# Patient Record
Sex: Male | Born: 1945
Health system: Southern US, Community
[De-identification: ages and names within clinical notes are randomized; demographics above are authoritative.]

## PROBLEM LIST (undated history)

## (undated) DIAGNOSIS — Z9989 Dependence on other enabling machines and devices: Secondary | ICD-10-CM

## (undated) DIAGNOSIS — Z8673 Personal history of transient ischemic attack (TIA), and cerebral infarction without residual deficits: Secondary | ICD-10-CM

## (undated) DIAGNOSIS — E785 Hyperlipidemia, unspecified: Secondary | ICD-10-CM

## (undated) DIAGNOSIS — I10 Essential (primary) hypertension: Secondary | ICD-10-CM

## (undated) DIAGNOSIS — U071 COVID-19: Secondary | ICD-10-CM

## (undated) DIAGNOSIS — I4891 Unspecified atrial fibrillation: Secondary | ICD-10-CM

## (undated) HISTORY — DX: Personal history of transient ischemic attack (TIA), and cerebral infarction without residual deficits: Z86.73

## (undated) HISTORY — DX: Essential (primary) hypertension: I10

## (undated) HISTORY — DX: Unspecified atrial fibrillation: I48.91

## (undated) HISTORY — PX: KNEE ARTHROTOMY: SUR107

## (undated) HISTORY — DX: Dependence on other enabling machines and devices: Z99.89

## (undated) HISTORY — PX: COLONOSCOPY: SHX174

## (undated) HISTORY — DX: COVID-19: U07.1

## (undated) HISTORY — DX: Hyperlipidemia, unspecified: E78.5

---

## 2007-03-30 ENCOUNTER — Ambulatory Visit: Payer: Self-pay | Admitting: Cardiology

## 2007-04-06 ENCOUNTER — Ambulatory Visit: Payer: Self-pay | Admitting: Cardiology

## 2007-04-09 ENCOUNTER — Ambulatory Visit: Payer: Self-pay | Admitting: Cardiology

## 2007-04-13 ENCOUNTER — Ambulatory Visit: Payer: Self-pay | Admitting: Cardiology

## 2007-04-22 ENCOUNTER — Ambulatory Visit: Payer: Self-pay | Admitting: Cardiology

## 2007-04-27 ENCOUNTER — Ambulatory Visit: Payer: Self-pay | Admitting: Cardiology

## 2007-04-30 ENCOUNTER — Ambulatory Visit: Payer: Self-pay | Admitting: Cardiology

## 2007-05-17 ENCOUNTER — Ambulatory Visit: Payer: Self-pay | Admitting: Cardiology

## 2007-05-21 ENCOUNTER — Ambulatory Visit: Payer: Self-pay | Admitting: Internal Medicine

## 2007-11-04 ENCOUNTER — Ambulatory Visit: Payer: Self-pay | Admitting: Cardiology

## 2007-12-03 ENCOUNTER — Ambulatory Visit: Payer: Self-pay | Admitting: Internal Medicine

## 2010-08-20 NOTE — Cardiovascular Report (Signed)
[facility]                   [facility] [facility]LECTROP[facility] DEVICE CLINIC NOTE   NAME:Moler, KYEL PURK                      MRN:          [id number]  DATE:12/03/2007                            DOB:          1945-05-11    Mr. Rieth comes in at the request of Tereso Newcomer to follow up  __________ discussions about atrial fibrillation and whether there is  any role for changing and initiating Tikosyn therapy.  He had failed  amiodarone and was now in permanent atrial fibrillation.  The patient  has no symptoms related to his atrial fibrillation as best as I can  tell.  He denies any impairment of exercise tolerance and describes how  he can outwork men 20 or 30 years younger than him.  He does have  occasional tachypalpitations.   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  He has had no other episodes of lightheadedness.   CURRENT MEDICATIONS:  1. Lisinopril 10.  2. Lovastatin 40.  3. Warfarin.  4. Metoprolol.   PHYSICAL EXAMINATION:  GENERAL:  He was in no acute distress.  VITAL SIGNS:  His blood pressure was 114/76.  His pulse was 86.  SKIN:  Warm and dry.  LUNGS:  Clear.  HEART:  Sounds were irregular.  EXTREMITIES:  Without edema.   IMPRESSION:  1. Atrial fibrillation - Permanent.  2. Thromboembolic risk factors, notable for CHADS score of 1.  3. Occasional palpitations.  4. Transient episode of falling.   Mr. Cindric, as relates to his atrial fibrillation, does not really want  to do anything.  In the absence of symptoms, we have no data to suggest  that intervening would be appropriate.  He is not interested in going  through a process of trying to restore sinus rhythm in the hope that  ablation data give [place] long-term benefit information.   I am concerned about the falling episode and wonder whether it may be  related to  bradyarrhythmia.  I have given him a request to get a 24-hour  Holter monitor from the [place].   The other issue to address with Dr. Andee Lineman when he returns is whether he  should be on Coumadin.  With a CHADS score of 1, it certainly is up for  discussion.   We will see him again at Dr. Margarita Mail request.     Duke Salvia, MD, [facility]  Electronically Signed    SCK/MedQ  DD: 12/03/2007  DT: 12/04/2007  Job #: [id number]

## 2010-08-20 NOTE — Assessment & Plan Note (Signed)
[facility]                          [facility] OFFICE NOTE   NAME:Douglas Lee, Douglas Lee                      MRN:          [id number]  DATE:03/30/2007                            DOB:          03/26/1946    REASON FOR CONSULTATION:  Mr. Douglas Lee is a 65 year old male who was  diagnosed with paroxysmal atrial fibrillation by Dr. Fara Chute, this  past May.  He was subsequently started on Coumadin and has been closely  followed by Dr. Neita Carp with respect to Coumadin dosing.  However, he has  also been closely followed by Dr. Marina Goodell of the [facility] in [place], who subsequently referred him to the cardiology team at the [facility] in [place].  He reports having been extensively evaluated for  his new onset atrial fibrillation, by the cardiology team in [place],  with 2D echocardiography and exercise stress testing, both reportedly  within normal limits (records currently pending).  At that point, he was  also started on antiarrhythmic treatment for his atrial fibrillation  with amiodarone at an initial dose of 200 mg b.i.d. for approximately 4-  5 days.  He was then instructed to cut this to 200 mg daily, where he  has remained since this past July.  He has been closely followed with  surveillance blood work, including liver and thyroid studies.   The patient presents with serial EKGs from Dr. Dian Situ office  indicating persistent atrial fibrillation with controlled ventricular  response with rates in the 55-65 bpm range.  Of note, the patient was  also placed on metoprolol by the cardiology team in [place], currently  at a dosing of 50 mg b.i.d.   He now presents for a second opinion regarding treatment options for  atrial fibrillation, particularly with respect to possible DC  cardioversion.  This apparently has been entertained in the past, but  the patient would like to have this performed here in [place], where he  resides.   From a  clinical standpoint, Mr. Douglas Lee denies any remote, or recent,  development of exertional chest discomfort or dyspnea.  He also denies  any history of tachy palpitations.  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  He was noted  to have an irregular pulse and was subsequently referred to Dr. Neita Carp,  where atrial fibrillation was confirmed by electrocardiogram.  The  patient, himself, did not have any associated symptoms during the  initial physical examination, when he was found to have the irregular  pulse.   An electrocardiogram in our office today shows atrial fibrillation at a  rate of 64 bpm with borderline left axis deviation and no ischemic  changes.   Most recent protime indicates an INR of 2.6 on March 11, 2007.  Preceding levels notable for 4.8, 1.7, and 3.3 at the beginning of  November.   ALLERGIES:  PENICILLIN, SULFA, AND LIPITOR INTOLERANCE.   CURRENT MEDICATIONS:  1. Amiodarone 200 mg daily.  2. Coumadin 2.5 mg as directed.  3. Metoprolol  50 mg b.i.d.  4. Lovastatin 40 mg daily.  5. Lisinopril 10 mg daily.   PAST MEDICAL HISTORY:  1. Atrial fibrillation with controlled ventricular response.      a.     Initially diagnosed in May 2008.  2. Hypertension.      a.     Approximately 10 years.  3. Hyperlipidemia.   SURGICAL HISTORY:  Right knee surgery.   REVIEW OF SYSTEMS:  The patient denies any prior history of myocardial  infarction, congestive heart failure, or stroke.  He denies any history  of diabetes mellitus.  Otherwise as noted in HPI, remaining systems  negative.   SOCIAL HISTORY:  The patient is married, currently for the third time,  and has 2 children.  He served in [employer] for 5 years.  He is  currently working as an Nutritional therapist.  He has never smoked tobacco  on a regular basis, and has not drank any alcohol for the  last 20 years  or so.   FAMILY HISTORY:  Father died at age 60, fatal myocardial infarction.  Mother deceased, age 1, complications of stroke.  Brother age 31,  status post pacemaker.   PHYSICAL EXAMINATION:  Blood pressure 123/85, pulse 71.  Irregular.  Weight 191.6.  GENERAL:  A 65 year old male sitting upright in no distress.  HEENT:  Normocephalic, atraumatic.  NECK:  Palpable.  Bilateral carotid pulses without bruits.  LUNGS:  Clear to auscultation in all fields.  HEART:  Irregularly irregular.  (S1 and S2).  No significant murmurs.  No rubs or gallops.  ABDOMEN:  Soft, nontender, intact bowel sounds.  EXTREMITIES:  Palpable peripheral pulses with no significant edema.  NEUROLOGIC:  No focal deficits.   IMPRESSION:  1. Persistent atrial fibrillation.      a.     Controlled ventricular response, on amiodarone and       metoprolol.      b.     CHAD2 score:  1 (hypertension).  2. Hypertension.  3. Hyperlipidemia.   PLAN:  1. Request all pertinent records from the cardiology group at the [facility] in [place], particularly with respect to 2D      echocardiography and stress test results.  2. We discussed the treatment option of direct current cardioversion      with Mr. Douglas Lee, who is quite amenable to this option.  In fact, he      would very much like to not remain on Coumadin indefinitely.  We      will therefore enroll him in our Coumadin clinic, beginning today      with a baseline protime, and closely monitor him over the next      several weeks to establish therapeutic INR readings for 4      consecutive weeks.  At that point, plan is then to schedule him for      elective DC cardioversion with Dr. Willa Rough, followed by an      additional 4 weeks of Coumadin anticoagulation.  If DC      cardioversion is successful, and the patient maintains normal sinus      rhythm, then we will discuss whether or not he can safely be taken      off Coumadin and  substitute this with aspirin.  Of note, given that      the patient has been fully loaded, by this point in time, with the      amiodarone,  he has a relatively higher likelihood of successful DC      cardioversion to normal sinus rhythm.  3. Schedule return clinic followup with myself and Dr. Willa Rough in      approximately 3 weeks, for review of his hospital records from      [facility] and continued clinical monitoring.      Rozell Searing, PA-C  Electronically Signed      Luis Abed, MD, [facility]  Electronically Signed   GS/MedQ  DD: 03/30/2007  DT: 03/30/2007  Job #: [id number]   cc:   Beaulah Corin, MD

## 2010-08-20 NOTE — Letter (Signed)
May 21, 2007    Learta Codding, MD,FACC  [address]  [place], [place] [address]   RE:  Douglas Lee, Douglas Lee  MRN:  [id number]  /  DOB:  1945/09/24   Dear Michelle Piper,   It was a pleasure to see Theophil Thivierge today at your request because of  atrial fibrillation.  He was found to have atrial fibrillation that  presented asymptomatically in May, 2008.  He then underwent initiation  of therapy with amiodarone with attempts to convert him, which was done  in January.  He failed to hold sinus rhythm, and his amiodarone has  intracurrently been discontinued.   The patient was unaware of his atrial fibrillation, and he has noted no  untoward effects in terms of his activity levels, shortness of breath,  edema, etc.   His thromboembolic risk factors are notable for hypertension.   His past medical history, in addition to the above, are notable for  dyslipidemia.   Past surgical history is notable for knee surgery.   SOCIAL HISTORY:  Patient is married, currently for the third time.  He  has two children.  He works as an Nutritional therapist.  He does not use  cigarettes, alcohol, or recreational drugs.   REVIEW OF SYSTEMS:  Noncontributory.   FAMILY HISTORY:  Notable for coronary artery disease.   His cardiac evaluation here to date apparently included an echo and a  stress test, the latter of which sounds like it was just graded exercise  that was associated with a rapid ventricular response.  This apparently  was undertaken prior to the initiation of beta blocker therapy.   PHYSICAL EXAMINATION:  He is an older Caucasian male appearing his  stated age of 30.  Blood pressure 133/86, pulse 64.  His weight was 190.  HEENT:  No icterus or xanthoma.  Neck veins are flat.  Carotids are brisk and full bilaterally without  bruits.  BACK:  Without kyphosis or scoliosis.  LUNGS:  Clear.  Heart sounds are irregular without murmurs or gallops.  ABDOMEN:  Soft with active bowel sounds without  midline pulsation or  hepatomegaly.  Femoral pulses are 2+.  Distal pulses are intact.  There is no clubbing,  cyanosis or edema.  NEUROLOGIC:  Grossly normal.  SKIN:  Warm and dry.   Electrocardiograms were reviewed.  They all demonstrated coarse atrial  fibrillation with controlled ventricular response.   IMPRESSION:  1. Atrial fibrillation, persistent/permanent.  2. Failed cardioversion on amiodarone.  3. Thromboembolic risk factors notable for hypertension with a CHADS      score of 1.  4. Dyslipidemia.   DISCUSSION:  Michelle Piper, Mr. Seehafer and I had a lengthy discussion regarding  treatment options for his atrial fibrillation.  Obviously, in the  absence of symptoms, symptomatic improvement cannot be accomplished by  medical therapy or ablation therapy.  There are ongoing clinical trials  asking the question in patients with persistent or paroxysmal atrial  fibrillation, does randomization to ablation therapy reduce mortality  compared to drug therapy, and knowing that drug therapy and rate control  therapy are equivalent over time, potentially extrapolatable to a rate  control course.  These trials almost certainly are going to be excluding  permanent atrial fibrillation because of lower success rates in this  cohort.  Thus, the only benefit that I could see in terms of intervening  with Mr. Davalos would be to ask the question, if we were able to get him  into atrial fibrillation and maintain him in atrial fibrillation, might  he then potentially be available for an ablative procedure, which might  have an impact on heart end points like stroke and/or death.  Obviously,  there are a lot of questions in that statement, and I have tried to  review these extensively with Mr. Macha so as he could make the best  decision we could.   In light of that, we will plan to:  1. Get his echo to look at his left atrial dimension.  2. Get an amiodarone level.  3. Consider the initiation of  Tikosyn to see if we can restore sinus      rhythm.  We notably did      discuss the potential pro-arrhythmic effects of Tikosyn and the      need for inpatient hospitalization and monitoring.   Thanks very much for allowing [place] to see him.    Sincerely,      Duke Salvia, MD, [facility]  Electronically Signed    SCK/MedQ  DD: 05/21/2007  DT: 05/24/2007  Job #: [id number]

## 2010-08-20 NOTE — Assessment & Plan Note (Signed)
[facility]                          [facility] OFFICE NOTE   NAME:Douglas Lee, Douglas Lee                      MRN:          [id number]  DATE:04/13/2007                            DOB:          1945/08/07    [name] is here for the follow-up of his at coumadinization to  finalize the plans for his cardioversion.  See the extensive note done  for consultation on March 30, 2007.  Dr. Dian Situ group has done an  excellent job of following Mr. Kea's INR over time.  More recently,  we took this over purely for the purpose of being able to watch it very  carefully as we prepared for cardioversion to be sure that we did not  cardiovert him on a day that his INR was less than 2.0.  Historically he  has been on 2.5 mg of Coumadin a day.  When his dose is increased to 2.5  alternating with 3 it appears that his INR goes too high.  However, when  the dose is lowered the INR can then drift down below 2.0.  With this in  mind, he has been on a dosing regimen of approximately two doses of 3.0  per week with the other doses being 2.5 mg.  His INR today is 2.9.  It  is very careful for [place] to stay slightly on the high side at the time of  cardioversion.  Therefore, I will keep him on 2.5 mg of Coumadin daily  with two doses of 3 mg per week, and this will be continued until we do  the cardioversion on the morning of April 26, 2007.  Cardioversion  will be arranged for that day.  He will have his INR checked next week.  I do not want his dose changed unless his INR were to be greater then  4.7.  I can be reached by cell phone when he is checked next week even  though I will be out of the state and we will make any adjustments  needed.  He understands that there is a slight chance that I will have  to change my schedule on the 19th, in which case cardioversion date will  be definitely moved to Friday the 23rd.   PAST MEDICAL HISTORY:  ALLERGIES:  PENICILLIN,  SULFA, LIPITOR AND EES.   MEDICATIONS:  Lisinopril, lovastatin, Coumadin, metoprolol and  amiodarone 100 mg b.i.d.   OTHER MEDICAL PROBLEMS:  See the list on the note of March 30, 2007.   REVIEW OF SYSTEMS:  He feels well today.  His review of systems is  negative.   PHYSICAL EXAM:  Blood pressure today is 125/83, heart rate is 60 but it  is irregularly irregular, his weight is 191 pounds.  The patient is  oriented to person, time and place.  Affect is normal.  HEENT:  Reveals no xanthelasma, he has normal extraocular motion.  LUNGS:  Clear.  CARDIAC:  Reveals an S1 with an S2, there are no clicks or significant  murmurs.  There is no peripheral edema.   PROBLEMS:  Listed on the  note of March 30, 2007.  1. Atrial fibrillation.  We will proceed with cardioversion on January      19 assuming there are no major changes between now and then.     Luis Abed, MD, [facility]  Electronically Signed    JDK/MedQ  DD: 04/13/2007  DT: 04/13/2007  Job #: [id number]   cc:   Fara Chute

## 2010-08-20 NOTE — Assessment & Plan Note (Signed)
[facility]                          [facility] OFFICE NOTE   NAME:Kasparek, JAWAN CHAVARRIA                      MRN:          [id number]  DATE:11/04/2007                            DOB:          Sep 18, 1945    CARDIOLOGIST:  Luis Abed, MD, [facility].   PRIMARY CARE PHYSICIAN:  Dr. Fara Chute.   REASON FOR VISIT:  Followup.   HISTORY OF PRESENT ILLNESS:  Mr. Sparling is a 65 year old male patient  with a history of persistent/permanent atrial fibrillation.  He  underwent attempted cardioversion on amiodarone therapy but failed this.  Amiodarone therapy has since been discontinued.  He has been maintained  on Coumadin therapy, although his CHADS2 score is 1 (hypertension).  The  patient returns for followup today.  He was last seen in the office by  Dr. Graciela Husbands in February 2009.  At that point in time, there was a  discussion of whether or not to proceed with Tikosyn therapy to restore  sinus rhythm.  The patient had an amiodarone level done in February 2009  that was low at 0.5.  We obtained his echocardiogram results from the [place]  that revealed mild left atrial enlargement at 44 mm, normal LV  contractility, and an EF of 55-60%, mild MR, and mild TR.  The patient  returns for followup today.  Overall, he is doing well.  He denies chest  pain, shortness of breath, syncope, near syncope, palpitations,  orthopnea, PND, or pedal edema.   CURRENT MEDICATIONS:  1. Lisinopril 10 mg daily.  2. Lovastatin 40 mg daily.  3. Warfarin as directed by Dr. Neita Carp.  4. Metoprolol 100 mg daily.  5. Multivitamin.  6. Vitamin C.  7. Vitamin D.  8. Vitamin A.  9. Garlic.  10.Fish oil.   PHYSICAL EXAMINATION:  GENERAL:  He is a well-nourished and well-  developed male, in no acute distress.  VITAL SIGNS:  Blood pressure is 128/87, pulse 67, and weight 190 pounds.  HEENT:  Normal.  CARDIAC:  Normal S1 and S2.  Regular rate and rhythm.  LUNGS:  Clear to auscultation  bilaterally.  NEUROLOGIC:  He is alert and oriented x3.  Cranial nerves II-XII are  grossly intact.   Electrocardiogram reveals atrial fibrillation with heart rate of 71, no  acute changes.   IMPRESSION:  1. Persistent/permanent atrial fibrillation      a.     CHADS2 score of 1 (hypertension).      b.     Coumadin therapy.      c.     History of failed cardioversion on amiodarone - amiodarone       discontinued.   PLAN:  Mr. Lares returns for followup regarding his arrhythmia.  Discussion was held with Dr. Graciela Husbands when he saw him in February about  proceeding with Tikosyn therapy.  The patient is open to this versus  continued rate control.  The patient has not yet seen Dr. Graciela Husbands back in  followup.  We will facilitate a followup appointment with Dr. Graciela Husbands  towards the end of August to discuss this issue further.  No medication  changes have been made today.      Tereso Newcomer, PA-C  Electronically Signed      Jonelle Sidle, MD  Electronically Signed   SW/MedQ  DD: 11/04/2007  DT: 11/05/2007  Job #: [id number]   cc:   Fara Chute

## 2010-08-23 NOTE — Assessment & Plan Note (Signed)
[facility]                          [facility] OFFICE NOTE   NAME:Douglas Lee, Douglas Lee                      MRN:          [id number]  DATE:05/16/2006                            DOB:          16-Dec-1945    HISTORY OF PRESENT ILLNESS:  The patient is a 65 year old male  previously diagnosed with paroxysmal atrial fibrillation by Dr. Neita Carp  in May.  The patient was seen and started on the Coumadin and has been  closely followed with respect to his Coumadin dosing.  The patient also  has been followed by Dr. Marina Goodell at the [facility],  referred through a cardiology team at the [facility] in [place].  He  underwent stress testing and echocardiographic study which reportedly  was within normal limits.  The patient was also started on anti-  arrhythmic therapy for atrial fibrillation, amiodarone, initial dose of  20 mg p.o. b.i.d.  The patient was then seen by our team on March 30, 2007 when he presented with presumed atrial fibrillation.  On our  review, the electrocardiograms suggested that the patient may actually  have atrial flutter.  He was still taking amiodarone and metoprolol.  He  is relatively asymptomatic in the presence of amiodarone.  The patient  was seen by Dr. Myrtis Ser, scheduled for a cardioversion.  The patient was  cardioverted on April 30, 2007 and reverted back to normal sinus  rhythm; however, he now presents to the office for followup, and appears  to be back in atrial arrhythmia.  The review of the EKG, however,  demonstrated the patient likely has atrial flutter with variable block.  The patient also tells me today that he declines to take any further  amiodarone, does not want to be long term on either anti-arrhythmic drug  therapy, and/or Coumadin therapy.  I explained carefully to the patient  his [place] score is low, and that from a Coumadin perspective it is only  required around the cardioversion.   The  patient denies any chest pain, shortness of breath, orthopnea, PND,  no palpitations or syncope.   MEDICATIONS:  1. Lisinopril 10 mg a day.  2. Lovastatin 40 mg a day.  3. Warfarin 2 mg p.o. daily.  4. Amiodarone 200 mg, 100 mg in the morning and 100 in the evening.  5. Metoprolol 100 mg half tablet p.o. b.i.d.  6. Multivitamin.  7. Vitamin C.  8. Vitamin D.  9. Vitamin A.  10.Garlic.  11.Fish oil.   INCOMPLETE DICTATION   CANCELED DICTATION     Learta Codding, MD,FACC  Electronically Signed    GED/MedQ  DD: 05/19/2007  DT: 05/20/2007  Job #: [id number]   cc:   Dr. Marina Goodell (unspecified)

## 2010-08-23 NOTE — Assessment & Plan Note (Signed)
[facility]                          [facility] OFFICE NOTE   NAME:Boehner, ROMANI WILBON                      MRN:          [id number]  DATE:04/30/2007                            DOB:          [birth year]    Mr. Safley was cardioverted at [facility] today, dictations were done.  He  actually converted with 100 joules and then within a few minutes with  back to atrial fib.  I then converted him with 200 joules and he  appeared to hold.  He went home on his usual meds.  Will make him an  appointment to see me in the office in approximately 2 weeks.  I  explained to him that he may not hold but we will see.     Luis Abed, MD, [facility]  Electronically Signed    JDK/MedQ  DD: 04/30/2007  DT: 04/30/2007  Job #: [id number]

## 2014-03-09 ENCOUNTER — Encounter: Payer: Self-pay | Admitting: Orthopedic Surgery

## 2014-03-09 ENCOUNTER — Ambulatory Visit (INDEPENDENT_AMBULATORY_CARE_PROVIDER_SITE_OTHER): Payer: Medicare Other

## 2014-03-09 ENCOUNTER — Ambulatory Visit (INDEPENDENT_AMBULATORY_CARE_PROVIDER_SITE_OTHER): Payer: Medicare Other | Admitting: Orthopedic Surgery

## 2014-03-09 VITALS — BP 175/103 | Ht 70.0 in | Wt 201.0 lb

## 2014-03-09 DIAGNOSIS — M2340 Loose body in knee, unspecified knee: Secondary | ICD-10-CM | POA: Insufficient documentation

## 2014-03-09 DIAGNOSIS — M2341 Loose body in knee, right knee: Secondary | ICD-10-CM

## 2014-03-09 DIAGNOSIS — M25561 Pain in right knee: Secondary | ICD-10-CM

## 2014-03-09 DIAGNOSIS — IMO0002 Reserved for concepts with insufficient information to code with codable children: Secondary | ICD-10-CM

## 2014-03-09 DIAGNOSIS — M2480 Other specific joint derangements of unspecified joint, not elsewhere classified: Secondary | ICD-10-CM

## 2014-03-09 NOTE — Progress Notes (Deleted)
   Subjective:    Patient ID: Douglas Lee, male    DOB: [birth year], 68 y.o.   MRN: [id number]  Knee Pain       Review of Systems     Objective:   Physical Exam        Assessment & Plan:  Diagnostic x-rays knee AP lateral with sunrise right knee  Patient is right knee pain  We have a well centered patella with some mild degenerative changes.  Lateral x-ray shows superior and inferior patellar osteophytes no other changes to note.  The right knee AP x-ray shows mild effusion with mild degenerative changes symmetric diffuse joint space narrowing mild sclerosis medial tibia physiologic valgus 67 noted  Mild degenerative arthritis right knee

## 2014-03-09 NOTE — Patient Instructions (Signed)
WE WILL SCHEDULE MRI FOR YOU AND CALL YOU WITH THE APPOINTMENT

## 2014-03-09 NOTE — Progress Notes (Signed)
Patient ID: Douglas Lee, male   DOB: [birth year], 68 y.o.   MRN: [id number] Subjective:    Douglas Lee is a 68 y.o. male who presents with a history of a loose body removed from his right knee 40 years ago he did well until about 15 months ago and got out of a truck and hyperextended his knee. He expressed extreme pain and he treated himself with Aleve and heat. However since that time he has noticed catching locking giving way symptoms of his knee especially has difficulty going up and down the stairs and cannot climb them sequentially. Notices intermittent swelling.  Past Medical History  Diagnosis Date  . Hypertension     Past Surgical History  Procedure Laterality Date  . Knee arthrotomy Right     40 yrs ago loose boby     Family History  Problem Relation Age of Onset  . Hypertension    . Heart attack      Social History History  Substance Use Topics  . Smoking status: Unknown If Ever Smoked  . Smokeless tobacco: Not on file  . Alcohol Use: No    Allergies not on file  Current Outpatient Prescriptions  Medication Sig Dispense Refill  . lisinopril (PRINIVIL,ZESTRIL) 10 MG tablet Take 10 mg by mouth daily.    Marland Kitchen lovastatin (MEVACOR) 40 MG tablet Take 40 mg by mouth at bedtime.    . metoprolol (LOPRESSOR) 100 MG tablet Take 100 mg by mouth 2 (two) times daily.     No current facility-administered medications for this visit.      Review of Systems A comprehensive review of systems was negative except for: Hearing loss or ringing in the ears irregular heartbeat with atrial fibrillation   Objective:    BP 175/103 mmHg  Ht 5\' 10"  (1.778 m)  Wt 201 lb (91.173 kg)  BMI 28.84 kg/m2 GENERAL normal appearance grooming and hygiene ORIENTATION to person place and time MOOD pleasant affect flat  UPPEREXTREMITIES: Normal  Ambulatory status normal  Right knee: Well-healed superolateral surgical incision with crepitance on range of motion tenderness over the lateral  patella. Tenderness medial joint line. Collateral ligaments are stable. Cruciate ligament posterior cruciate normal anterior cruciate ligament questionable Lockman positivity. Motor exam normal. No effusion. we also find crepitance on patellofemoral range of motion in the positive patellofemoral stress test for pain   Left knee:  normal, no effusion, full active range of motion, no joint line tenderness, ligamentous structures intact. and Normal strength   SKIN normal wall 4 extremities  CV normal all 4 extremities  LYMPH negative  SENSATION normal 4 extremities  DTR 2+ and equal bilaterally  COORDINATION BALANCE normal    Assessment:   Diagnostic x-rays knee AP lateral with sunrise right knee  Patient is right knee pain  We have a well centered patella with some mild degenerative changes.  Lateral x-ray shows superior and inferior patellar osteophytes no other changes to note.  The right knee AP x-ray shows mild effusion with mild degenerative changes symmetric diffuse joint space narrowing mild sclerosis medial tibia physiologic valgus 67 noted  Mild degenerative arthritis right knee  Hyperextension right knee with patellofemoral arthritis question loose body Plan:  Recommend MRI to assess for loose body and possible surgical excision.

## 2014-03-27 ENCOUNTER — Telehealth: Payer: Self-pay | Admitting: Orthopedic Surgery

## 2014-03-27 NOTE — Telephone Encounter (Signed)
i have no answer

## 2014-03-27 NOTE — Telephone Encounter (Signed)
Patient has spoken with Environmental education officer, Remo Lipps, following an apparent re-schedule of MRI, which had occurred via Radiology at [facility];  He is now scheduled back onto his original MRI appointment date, 03/30/14, (NOT 04/03/14 which Forestine Na tried to re-schedule to) and he is aware.  He had mentioned my name to Remo Lipps, however, I was uninvolved in the scheduling or re-scheduling process of MRI; I had spoken with him last Thursday, 03/23/14, when he called to inquire about appointment, which was followed by a call back from our back office staff.  His main concern is whether he is going to be able to have surgery by 04/05/14, which he states he discussed with Dr Aline Brochure, if indicated by MRI.  Please advise.  Patient's ph# is [phone].

## 2014-03-30 ENCOUNTER — Ambulatory Visit (HOSPITAL_COMMUNITY): Payer: Medicare Other

## 2014-03-30 ENCOUNTER — Ambulatory Visit (HOSPITAL_COMMUNITY)
Admission: RE | Admit: 2014-03-30 | Discharge: 2014-03-30 | Disposition: A | Discharge status: Home / Self Care | Payer: Medicare Other | Source: Ambulatory Visit | Attending: Orthopedic Surgery | Admitting: Orthopedic Surgery

## 2014-03-30 DIAGNOSIS — M25761 Osteophyte, right knee: Secondary | ICD-10-CM | POA: Insufficient documentation

## 2014-03-30 DIAGNOSIS — M25461 Effusion, right knee: Secondary | ICD-10-CM | POA: Insufficient documentation

## 2014-03-30 DIAGNOSIS — M25861 Other specified joint disorders, right knee: Secondary | ICD-10-CM | POA: Insufficient documentation

## 2014-03-30 DIAGNOSIS — IMO0002 Reserved for concepts with insufficient information to code with codable children: Secondary | ICD-10-CM

## 2014-03-30 DIAGNOSIS — M25561 Pain in right knee: Secondary | ICD-10-CM | POA: Insufficient documentation

## 2014-04-03 ENCOUNTER — Telehealth: Payer: Self-pay | Admitting: Orthopedic Surgery

## 2014-04-03 ENCOUNTER — Ambulatory Visit (HOSPITAL_COMMUNITY): Admission: RE | Admit: 2014-04-03 | Payer: Medicare Other | Source: Ambulatory Visit

## 2014-04-03 NOTE — Telephone Encounter (Signed)
Received call from patient following having MRI 03/30/14 - requests results; asking if still to be scheduled for surgery 04/05/14, and is awaiting this information.  Please advise. Ph# [phone]

## 2014-04-03 NOTE — Telephone Encounter (Signed)
04/03/14 Refer to new phone note entered today in which MRI results were given to patient, and follow up appointment scheduled, per Dr Aline Brochure, for 04/06/14.  Patient aware.

## 2014-04-03 NOTE — Telephone Encounter (Signed)
i reviewed the mri will not need surgery   Come in 31st ill discuss with him   Mri just showed arthritis

## 2014-04-03 NOTE — Telephone Encounter (Signed)
Patient aware and he has been scheduled

## 2014-04-06 ENCOUNTER — Ambulatory Visit (INDEPENDENT_AMBULATORY_CARE_PROVIDER_SITE_OTHER): Payer: Medicare Other | Admitting: Orthopedic Surgery

## 2014-04-06 ENCOUNTER — Encounter: Payer: Self-pay | Admitting: Orthopedic Surgery

## 2014-04-06 VITALS — BP 152/83 | Ht 70.0 in | Wt 195.0 lb

## 2014-04-06 DIAGNOSIS — M2241 Chondromalacia patellae, right knee: Secondary | ICD-10-CM | POA: Insufficient documentation

## 2014-04-06 DIAGNOSIS — M1711 Unilateral primary osteoarthritis, right knee: Secondary | ICD-10-CM

## 2014-04-06 NOTE — Patient Instructions (Addendum)
If the injection fails to provide improvement please call to make another appointment   You have received an injection of steroids into the joint. 15% of patients will have increased pain within the 24 hours postinjection.   This is transient and will go away.   We recommend that you use ice packs on the injection site for 20 minutes every 2 hours and extra strength Tylenol 2 tablets every 8 as needed until the pain resolves.  If you continue to have pain after taking the Tylenol and using the ice please call the office for further instructions.

## 2014-04-06 NOTE — Progress Notes (Signed)
Patient ID: Douglas Lee, male   DOB: 31-Dec-1945, 68 y.o.   MRN: [id number] Chief Complaint  Patient presents with  . Follow-up    MRI results of right knee.     reevaluate in review ongoing pain discomfort anterior aspect right knee and clicking sensation lateral aspect right knee. Patient status post arthroscopy and removal of loose body and incision at same setting lateral portion of knee. Complains of clicking popping when he bends his knee and discomfort behind his kneecap with occasional feeling that there is something in the joint.  Review of systems no numbness or tingling. Mechanical symptoms noted.  Reexamination you can feel a palpable clicking popping when he flexes and extends his knee is more over the lateral part of the leg and iliotibial band and intra-articular. His range of motion stability and strength examinations remain normal. Skin is intact. He walks normally. His vital signs are stable.  I've reviewed his MRI there is evidence of patellofemoral disease and arthritis with mild degeneration of the medial and lateral compartments. Small joint effusion noted no evidence of Baker's cyst small osteophytes are seen throughout the knee  Impression patellofemoral arthritis mild tibiofemoral arthritis  I reviewed the studies with the patient reexamined his knee    recommend he get an injection and call us back after a month if the injection doesn't help his pain or discomfort.  Procedure note right knee injection verbal consent was obtained to inject right knee joint  Timeout was completed to confirm the site of injection  The medications used were 40 mg of Depo-Medrol and 1% lidocaine 3 cc  Anesthesia was provided by ethyl chloride and the skin was prepped with alcohol.  After cleaning the skin with alcohol a 20-gauge needle was used to inject the right knee joint. There were no complications. A sterile bandage was applied.

## 2015-07-04 DIAGNOSIS — K642 Third degree hemorrhoids: Secondary | ICD-10-CM | POA: Diagnosis not present

## 2015-07-05 DIAGNOSIS — I10 Essential (primary) hypertension: Secondary | ICD-10-CM | POA: Diagnosis not present

## 2015-07-05 DIAGNOSIS — E7800 Pure hypercholesterolemia, unspecified: Secondary | ICD-10-CM | POA: Diagnosis not present

## 2015-07-05 DIAGNOSIS — K649 Unspecified hemorrhoids: Secondary | ICD-10-CM | POA: Diagnosis not present

## 2015-07-05 DIAGNOSIS — Z880 Allergy status to penicillin: Secondary | ICD-10-CM | POA: Diagnosis not present

## 2015-07-05 DIAGNOSIS — I4891 Unspecified atrial fibrillation: Secondary | ICD-10-CM | POA: Diagnosis not present

## 2015-07-05 DIAGNOSIS — Z882 Allergy status to sulfonamides status: Secondary | ICD-10-CM | POA: Diagnosis not present

## 2015-07-05 DIAGNOSIS — Z79899 Other long term (current) drug therapy: Secondary | ICD-10-CM | POA: Diagnosis not present

## 2015-07-06 DIAGNOSIS — K649 Unspecified hemorrhoids: Secondary | ICD-10-CM | POA: Diagnosis not present

## 2015-07-06 DIAGNOSIS — K625 Hemorrhage of anus and rectum: Secondary | ICD-10-CM | POA: Diagnosis not present

## 2015-07-12 HISTORY — PX: MINOR HEMORRHOIDECTOMY: SHX6238

## 2015-08-01 ENCOUNTER — Encounter: Payer: Self-pay | Admitting: *Deleted

## 2015-08-02 ENCOUNTER — Encounter: Payer: Self-pay | Admitting: Cardiology

## 2015-08-02 ENCOUNTER — Ambulatory Visit (INDEPENDENT_AMBULATORY_CARE_PROVIDER_SITE_OTHER): Payer: PPO | Admitting: Cardiology

## 2015-08-02 VITALS — BP 138/95 | HR 82 | Ht 70.0 in | Wt 195.8 lb

## 2015-08-02 DIAGNOSIS — I10 Essential (primary) hypertension: Secondary | ICD-10-CM

## 2015-08-02 DIAGNOSIS — I482 Chronic atrial fibrillation, unspecified: Secondary | ICD-10-CM

## 2015-08-02 DIAGNOSIS — E785 Hyperlipidemia, unspecified: Secondary | ICD-10-CM

## 2015-08-02 NOTE — Progress Notes (Signed)
Cardiology Office Note  Date: 08/02/2015   ID: Douglas, Lee 1945-06-03, MRN [id number]  PCP: Manon Hilding, MD  Evaluating Cardiologist: Rozann Lesches, MD   Chief Complaint  Patient presents with  . Atrial Fibrillation    History of Present Illness: Douglas Lee is a 70 y.o. male referred to the office by Dr. Quintin Alto for cardiac evaluation. He was followed by our practice years ago, Dr. Dannielle Burn and Dr. Caryl Comes. I was not able to pull up their previous notes. He has been following through the [facility] clinic in [place] for years. We discussed his history, he has had chronic atrial fibrillation for at least a decade, has been on Lopressor and aspirin during this time. He is not overly symptomatic in terms of palpitations, describes an occasional, very atypical, pin-like sensation on left side of his chest that is sporadic. He has no history of obstructive CAD or myocardial infarction. 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  He presents today to establish regular cardiology follow-up. He does not endorse any major change in functional capacity. He brought in records from the [facility] system including ECGs, most recent one I reviewed was from March of this year showing rate-controlled atrial fibrillation at 80 bpm with leftward axis and decreased R wave progression.  He does not have any history of cardiomyopathy, no recent echocardiogram for evaluation of LVEF. His CHADSVASC score is 2 at this point. Annual risk of stroke on aspirin is approximately 2.3% versus 0.8% on an agent such as Eliquis. We discussed this today, and after our discussion, plan is to continue on aspirin for now.  He states he remains active, works outdoors, reports NYHA class II dyspnea at  baseline.  Past Medical History  Diagnosis Date  . Essential hypertension   . Atrial fibrillation ([place])   . Hyperlipemia     Past Surgical History  Procedure Laterality Date  . Knee arthrotomy Right   . Minor hemorrhoidectomy  07/12/2015    Current Outpatient Prescriptions  Medication Sig Dispense Refill  . aspirin 325 MG EC tablet Take 325 mg by mouth every other day.    . Calcium Carbonate (CALCIUM 600 PO) Take 1 tablet by mouth daily.    . Cholecalciferol (VITAMIN D PO) Take 1 tablet by mouth daily.    . Coenzyme Q10 (CO Q-10 PO) Take 1 tablet by mouth daily.    Marland Kitchen L-LYSINE PO Take 1 tablet by mouth daily.    Marland Kitchen lisinopril (PRINIVIL,ZESTRIL) 10 MG tablet Take 10 mg by mouth daily.    Marland Kitchen lovastatin (MEVACOR) 40 MG tablet Take 40 mg by mouth at bedtime.    . metoprolol (LOPRESSOR) 100 MG tablet Take 100 mg by mouth 2 (two) times daily.    . Multiple Vitamin (MULTIVITAMIN) tablet Take 1 tablet by mouth daily.    Marland Kitchen VITAMIN E PO Take 1 tablet by mouth daily.     No current facility-administered medications for this visit.   Allergies:  Lipitor; Penicillins; and Sulfur   Social History: The patient  reports that he has never smoked. He does not have any smokeless tobacco history on file. He reports that he does not drink alcohol or use illicit drugs.   Family History: The patient's family history includes Arrhythmia in  his brother; Heart attack (age of onset: 62) in his father; Stroke (age of onset: 19) in his mother.   ROS:  Please see the history of present illness. Otherwise, complete review of systems is positive for recent hemorrhoid surgery.  All other systems are reviewed and negative.   Physical Exam: VS:  BP 138/95 mmHg  Pulse 82  Ht 5\' 10"  (1.778 m)  Wt 195 lb 12.8 oz (88.814 kg)  BMI 28.09 kg/m2  SpO2 97%, BMI Body mass index is 28.09 kg/(m^2).  Wt Readings from Last 3 Encounters:  08/02/15 195 lb 12.8 oz (88.814 kg)  04/06/14 195 lb (88.451 kg)  03/30/14 195 lb  (88.451 kg)    General: Patient appears comfortable at rest. HEENT: Conjunctiva and lids normal, oropharynx clear. Neck: Supple, no elevated JVP or carotid bruits, no thyromegaly. Lungs: Clear to auscultation, nonlabored breathing at rest. Cardiac: Irregularly irregular, no S3 or significant systolic murmur, no pericardial rub. Abdomen: Soft, nontender, bowel sounds present, no guarding or rebound. Extremities: No pitting edema, distal pulses 2+. Skin: Warm and dry. Musculoskeletal: No kyphosis. Neuropsychiatric: Alert and oriented x3, affect grossly appropriate.  ECG: He declined ECG today since he brought recent copy of study from March 2017.  Recent Labwork:  April 2016: Cholesterol 189, triglycerides 121, HDL 56, LDL 109, PSA 0.17, potassium 4.4, BUN 20, creatinine 1.2  Assessment and Plan:  1. Chronic atrial fibrillation, not particularly symptomatic in terms of palpitations, and long-standing for many years. His CHADSVASC score is 2 at this time. After discussion, plan is to continue on current doses of Lopressor and aspirin. Can continue to discuss role of anticoagulation over time, particularly as risk factor profile evolves. We will obtain an echocardiogram for cardiac structural assessment.  2. Essential hypertension, currently on lisinopril and Lopressor. Keep follow-up with PCP.  3. Hyperlipidemia, on Mevacor. Reports prior intolerance to Lipitor. Most recent LDL 109.  Current medicines were reviewed with the patient today.   Orders Placed This Encounter  Procedures  . ECHOCARDIOGRAM COMPLETE    Disposition: FU with me in 1 year.   Signed, Satira Sark, MD, [facility] 08/02/2015 2:28 PM    [name] [place], [place], [place] [address] Phone: [phone]; Fax: [phone]

## 2015-08-02 NOTE — Patient Instructions (Signed)
Your physician recommends that you continue on your current medications as directed. Please refer to the Current Medication list given to you today. Your physician has requested that you have an echocardiogram. Echocardiography is a painless test that uses sound waves to create images of your heart. It provides your doctor with information about the size and shape of your heart and how well your heart's chambers and valves are working. This procedure takes approximately one hour. There are no restrictions for this procedure. Your physician recommends that you schedule a follow-up appointment in: 1 year. You will receive a reminder letter in the mail in about 10 months reminding you to call and schedule your appointment. If you don't receive this letter, please contact our office. 

## 2015-08-09 ENCOUNTER — Ambulatory Visit (INDEPENDENT_AMBULATORY_CARE_PROVIDER_SITE_OTHER): Payer: PPO

## 2015-08-09 ENCOUNTER — Other Ambulatory Visit: Payer: Self-pay

## 2015-08-09 DIAGNOSIS — I482 Chronic atrial fibrillation, unspecified: Secondary | ICD-10-CM

## 2015-08-10 ENCOUNTER — Telehealth: Payer: Self-pay | Admitting: *Deleted

## 2015-08-10 NOTE — Telephone Encounter (Signed)
-----   Message from Satira Sark, MD sent at 08/09/2015 12:53 PM EDT ----- Reviewed report. LVEF is normal range of 60-65%. Left atrial enlargement would be consistent with his history of atrial fibrillation. No major valvular abnormalities at this time. Continue with current plan and follow-up.

## 2015-08-10 NOTE — Telephone Encounter (Signed)
Patient informed. 

## 2015-09-11 DIAGNOSIS — H538 Other visual disturbances: Secondary | ICD-10-CM | POA: Diagnosis not present

## 2015-09-11 DIAGNOSIS — H2513 Age-related nuclear cataract, bilateral: Secondary | ICD-10-CM | POA: Diagnosis not present

## 2016-03-03 DIAGNOSIS — I4891 Unspecified atrial fibrillation: Secondary | ICD-10-CM | POA: Diagnosis not present

## 2016-03-03 DIAGNOSIS — E7800 Pure hypercholesterolemia, unspecified: Secondary | ICD-10-CM | POA: Diagnosis not present

## 2016-03-03 DIAGNOSIS — I10 Essential (primary) hypertension: Secondary | ICD-10-CM | POA: Diagnosis not present

## 2016-03-03 DIAGNOSIS — J309 Allergic rhinitis, unspecified: Secondary | ICD-10-CM | POA: Diagnosis not present

## 2016-06-11 DIAGNOSIS — E781 Pure hyperglyceridemia: Secondary | ICD-10-CM | POA: Diagnosis not present

## 2016-06-11 DIAGNOSIS — Z713 Dietary counseling and surveillance: Secondary | ICD-10-CM | POA: Diagnosis not present

## 2016-06-11 DIAGNOSIS — I4891 Unspecified atrial fibrillation: Secondary | ICD-10-CM | POA: Diagnosis not present

## 2016-06-11 DIAGNOSIS — I10 Essential (primary) hypertension: Secondary | ICD-10-CM | POA: Diagnosis not present

## 2016-06-11 DIAGNOSIS — E663 Overweight: Secondary | ICD-10-CM | POA: Diagnosis not present

## 2016-06-11 DIAGNOSIS — Z299 Encounter for prophylactic measures, unspecified: Secondary | ICD-10-CM | POA: Diagnosis not present

## 2016-06-11 DIAGNOSIS — E7800 Pure hypercholesterolemia, unspecified: Secondary | ICD-10-CM | POA: Diagnosis not present

## 2016-06-22 IMAGING — MR MR KNEE*R* W/O CM
4 of 6 series · 13 of 40 positions shown · non-contrast
Comparison: Plain films right knee 03/09/2014.

CLINICAL DATA: Lateral right knee pain and popping.

EXAM:
MRI OF THE RIGHT KNEE WITHOUT CONTRAST
TECHNIQUE: Multiplanar, multisequence MR imaging of the knee was performed. No
intravenous contrast was administered.

[Series 3: pdfs axial · axial · 3.0mm · 0.22mm/px · z∈[-55,+17]mm · 3 of 30 slices shown]
[im 5/30]
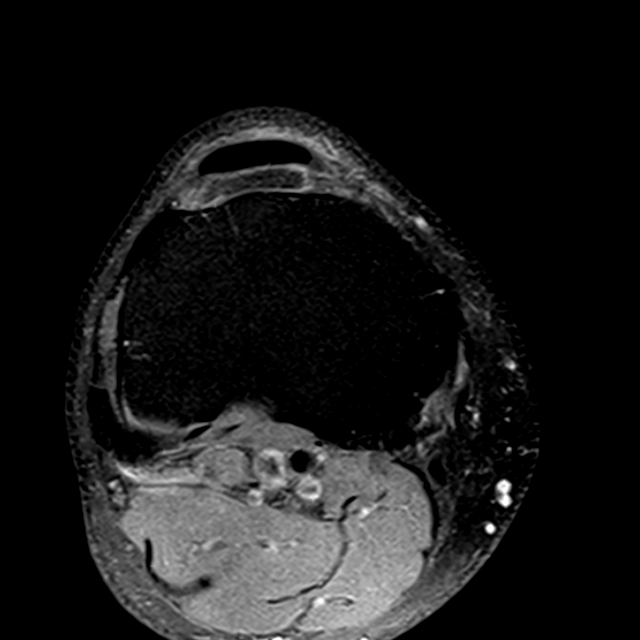
[im 17/30]
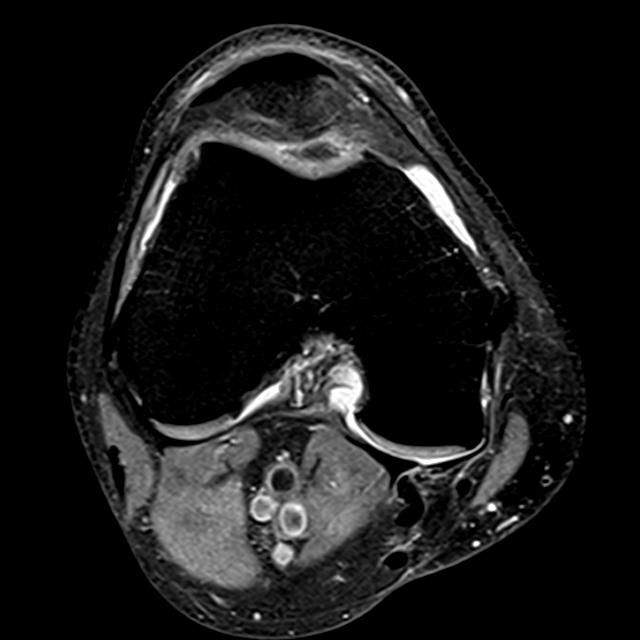
[im 25/30]
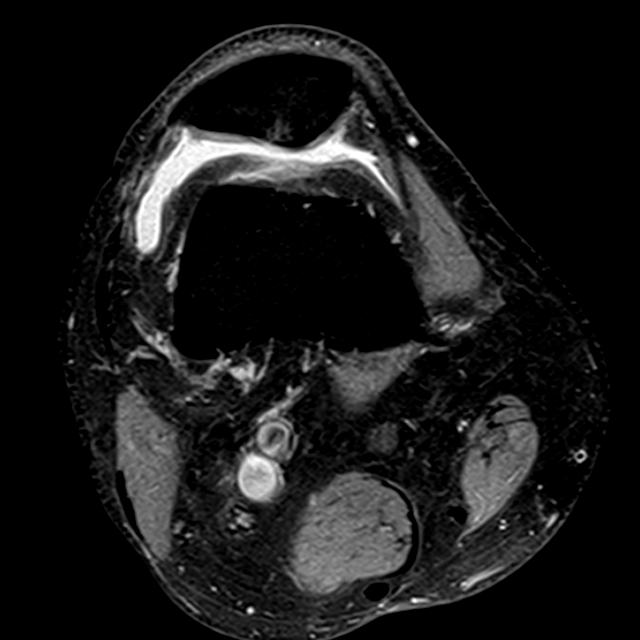

[Series 4: T1 · coronal · 3.0mm · 0.18mm/px · 4 of 28 slices shown]
[im 1/28]
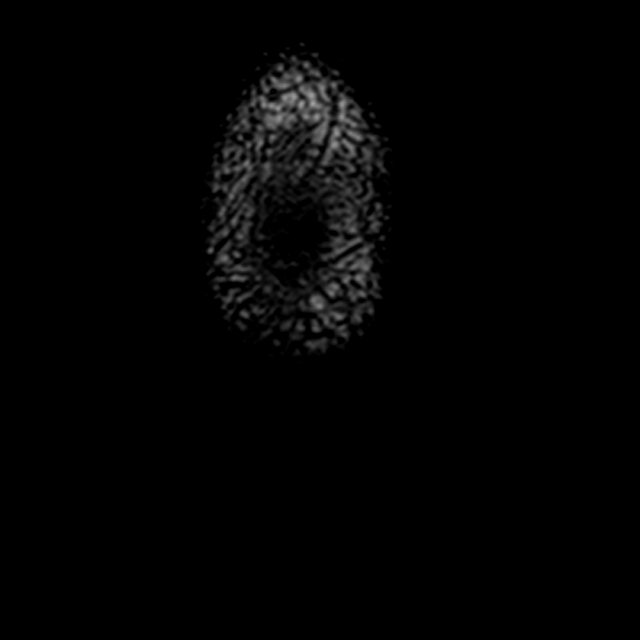
[im 5/28]
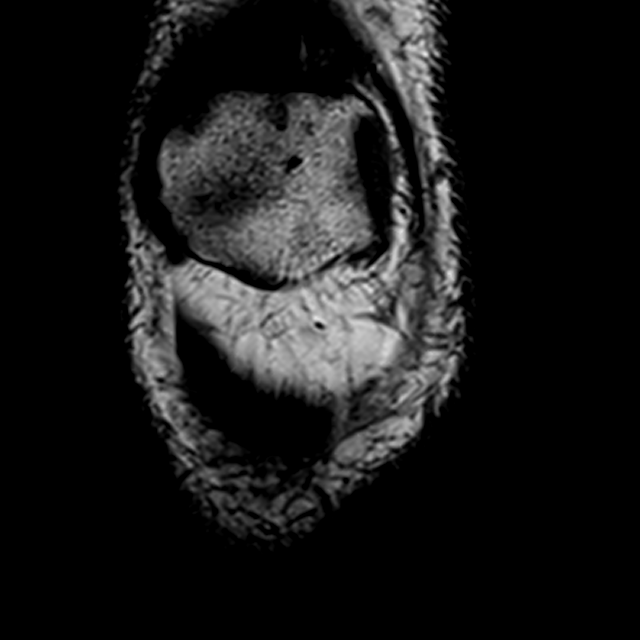
[im 14/28]
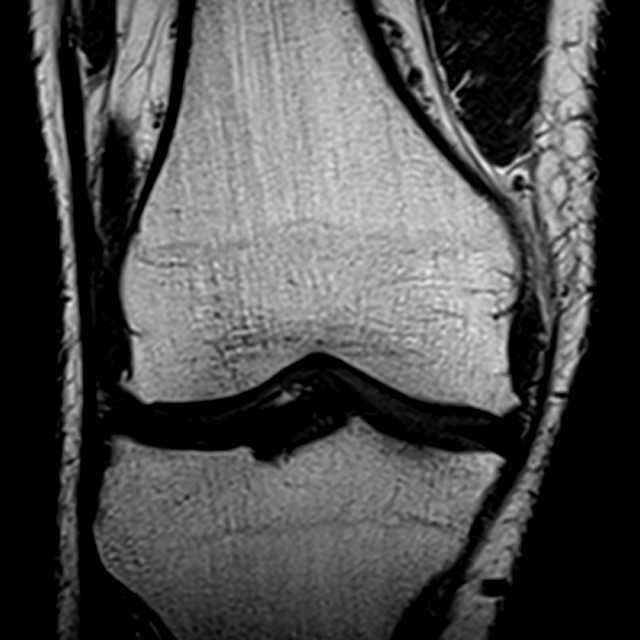
[im 23/28]
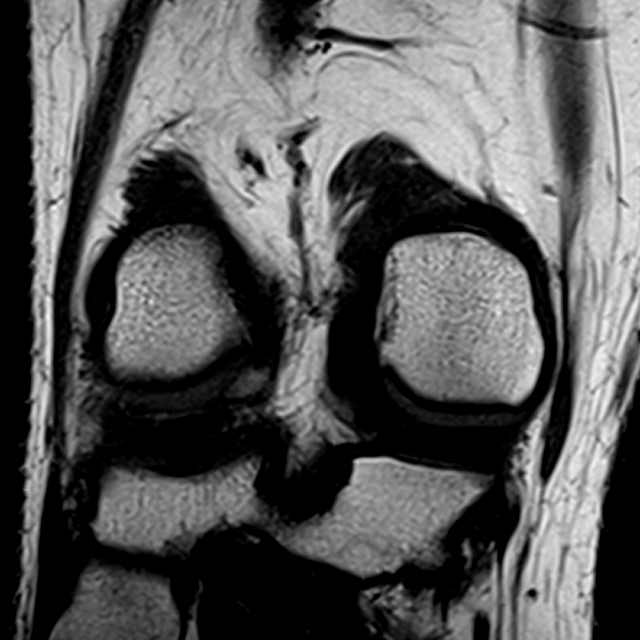

[Series 5: pdfs sag · sagittal · 3.0mm · 0.20mm/px · 3 of 29 slices shown]
[im 5/29]
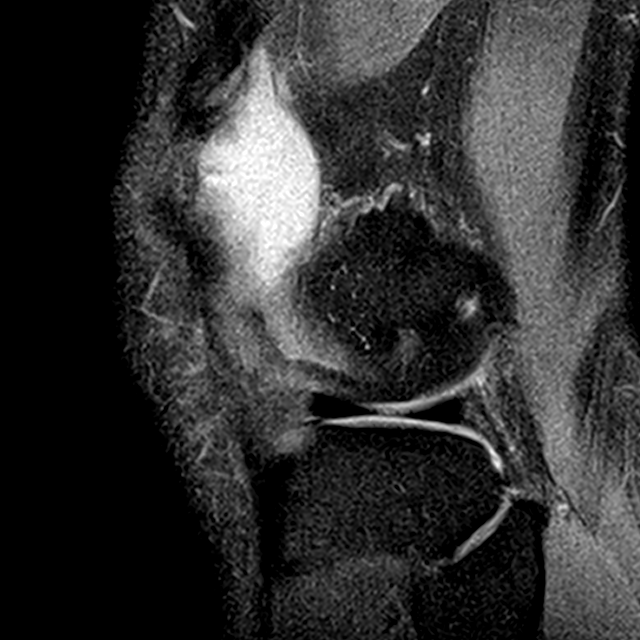
[im 15/29]
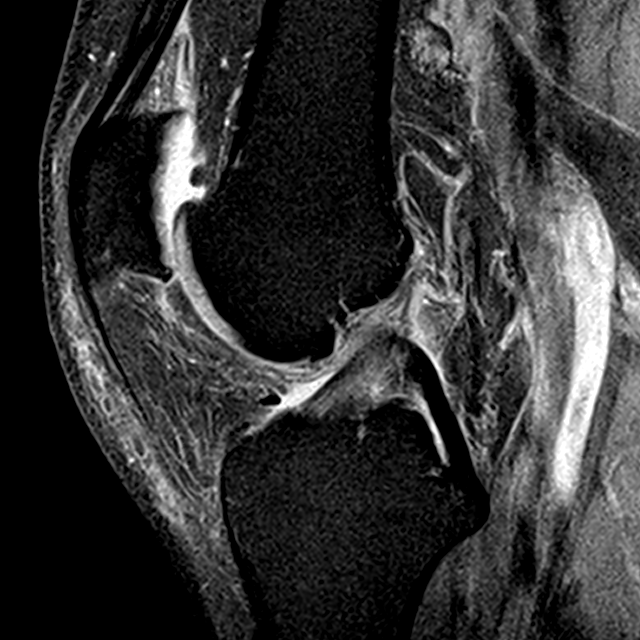
[im 24/29]
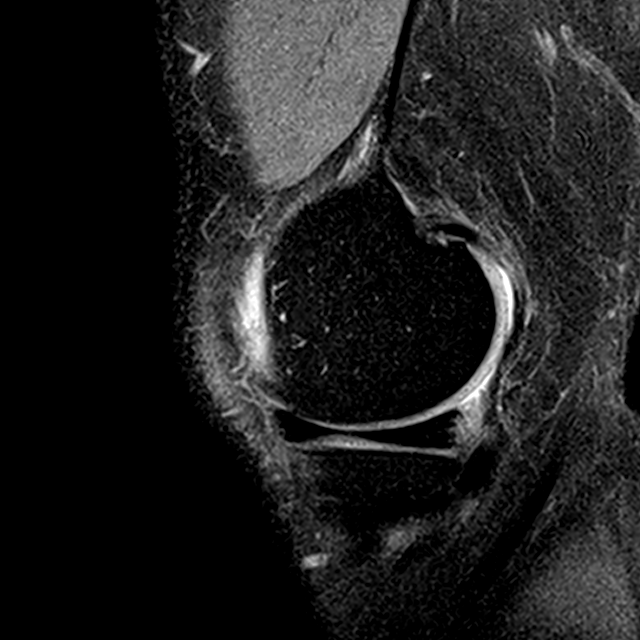

[Series 6: t2fs cor · coronal · 3.0mm · 0.18mm/px · 3 of 28 slices shown]
[im 5/28]
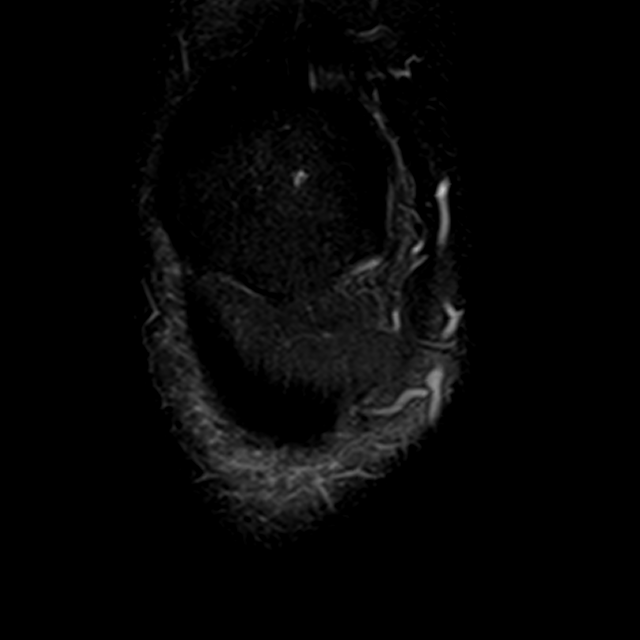
[im 14/28]
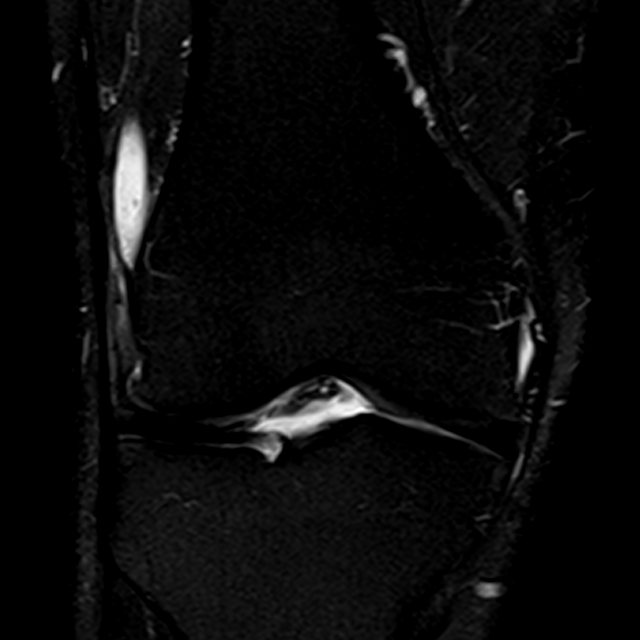
[im 23/28]
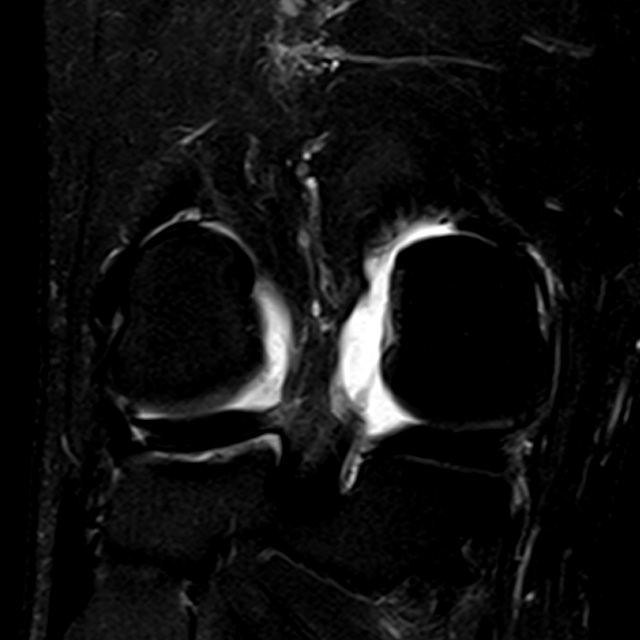

[13 of 40 positions shown; findings below may reference images not displayed]

FINDINGS: MENISCI

Medial meniscus:  Intact.

Lateral meniscus:  Intact.

LIGAMENTS

Cruciates:  Intact.

Collaterals:  Intact.

CARTILAGE

Patellofemoral: Small fissure is seen in hyaline cartilage along the
lateral patellar facet and cartilage in the superior pole is
diffusely thinned. Tiny subchondral cysts at the apex of the patella
are noted.

Medial:  Mildly degenerated.

Lateral:  Minimally degenerated.

Joint:  Small joint effusion.

Popliteal Fossa:  No Baker's cyst.

Extensor Mechanism:  Intact.

Bones:  Small osteophytes are seen about the knee.
IMPRESSION: Negative for meniscal or ligament tear.

Mild degenerative change most notable in the patellofemoral and
medial compartments.

## 2016-06-26 DIAGNOSIS — L308 Other specified dermatitis: Secondary | ICD-10-CM | POA: Diagnosis not present

## 2016-06-26 DIAGNOSIS — D225 Melanocytic nevi of trunk: Secondary | ICD-10-CM | POA: Diagnosis not present

## 2016-06-26 DIAGNOSIS — L820 Inflamed seborrheic keratosis: Secondary | ICD-10-CM | POA: Diagnosis not present

## 2016-08-04 NOTE — Progress Notes (Signed)
Cardiology Office Note  Date: 08/05/2016   ID: Douglas Lee, DOB [birth year], MRN [id number]  PCP: Glenda Chroman, MD  Primary Cardiologist: Rozann Lesches, MD   Chief Complaint  Patient presents with  . Atrial Fibrillation    History of Present Illness: Douglas Lee is a 71 y.o. male that I met in April of last year. He presents for a routine follow-up visit. States that he feels well, no exertional fatigue or progressive palpitations. He has changed PCP, now following with [facility].   Follow-up echocardiogram from May 2017 is outlined below, LVEF 60-65% with severe left atrial enlargement.  CHADSVASC score is 2. He has preferred aspirin so far as opposed anticoagulant therapy. This remains the case today.  I personally reviewed his ECG today which shows rate-controlled atrial fibrillation. Current medications include aspirin, lisinopril, Lopressor, and Mevacor.  Past Medical History:  Diagnosis Date  . Atrial fibrillation ([place])   . Essential hypertension   . Hyperlipemia     Past Surgical History:  Procedure Laterality Date  . KNEE ARTHROTOMY Right   . MINOR HEMORRHOIDECTOMY  07/12/2015    Current Outpatient Prescriptions  Medication Sig Dispense Refill  . aspirin 325 MG EC tablet Take 325 mg by mouth every other day.    . Calcium Carbonate (CALCIUM 600 PO) Take 1 tablet by mouth daily.    . Cholecalciferol (VITAMIN D PO) Take 1 tablet by mouth daily.    . Coenzyme Q10 (CO Q-10 PO) Take 1 tablet by mouth daily.    . Cyanocobalamin (VITAMIN B 12 PO) Take 1,000 mcg by mouth daily.     Marland Kitchen L-LYSINE PO Take 1 tablet by mouth daily.    Marland Kitchen lisinopril (PRINIVIL,ZESTRIL) 10 MG tablet Take 10 mg by mouth daily.    Marland Kitchen lovastatin (MEVACOR) 40 MG tablet Take 40 mg by mouth at bedtime.    . metoprolol (LOPRESSOR) 100 MG tablet Take 100 mg by mouth 2 (two) times daily.    . Multiple Vitamin (MULTIVITAMIN) tablet Take 1 tablet by mouth daily.    Marland Kitchen VITAMIN E PO Take  1 tablet by mouth daily.     No current facility-administered medications for this visit.    Allergies:  Lipitor [atorvastatin]; Penicillins; and Sulfur   Social History: The patient  reports that he has never smoked. He has never used smokeless tobacco. He reports that he does not drink alcohol or use drugs.   ROS:  Please see the history of present illness. Otherwise, complete review of systems is positive for none.  All other systems are reviewed and negative.   Physical Exam: VS:  BP (!) 140/92   Pulse 75   Ht _0  (1.778 m)   Wt 199 lb (90.3 kg)   BMI 28.55 kg/m , BMI Body mass index is 28.55 kg/m.  Wt Readings from Last 3 Encounters:  08/05/16 199 lb (90.3 kg)  08/02/15 195 lb 12.8 oz (88.8 kg)  04/06/14 195 lb (88.5 kg)    General: Patient appears comfortable at rest. HEENT: Conjunctiva and lids normal, oropharynx clear. Neck: Supple, no elevated JVP or carotid bruits, no thyromegaly. Lungs: Clear to auscultation, nonlabored breathing at rest. Cardiac: Irregularly irregular, no S3 or significant systolic murmur, no pericardial rub. Abdomen: Soft, nontender, bowel sounds present, no guarding or rebound. Extremities: No pitting edema, distal pulses 2+. Skin: Warm and dry. Musculoskeletal: No kyphosis. Neuropsychiatric: Alert and oriented x3, affect grossly appropriate.  ECG: I personally reviewed the tracing  from 07/05/2015 which showed rate-controlled atrial fibrillation with leftward axis.  Recent Labwork: No results found for requested labs within last 8760 hours.  No results found for: CHOL, TRIG, HDL, CHOLHDL, VLDL, LDLCALC, LDLDIRECT  Other Studies Reviewed Today:  Echocardiogram 08/09/2015: Study Conclusions  - Left ventricle: The cavity size was normal. Wall thickness was   increased in a pattern of mild LVH. Systolic function was normal.   The estimated ejection fraction was in the range of 60% to 65%.   Wall motion was normal; there were no regional  wall motion   abnormalities. - Aortic valve: Mildly calcified annulus. Trileaflet; mildly   thickened leaflets. Valve area (VTI): 2.24 cm^2. Valve area   (Vmax): 1.93 cm^2. Valve area (Vmean): 2.14 cm^2. - Mitral valve: Mildly calcified annulus. Normal thickness leaflets. - Left atrium: The atrium was severely dilated. - Right ventricle: The cavity size was mildly dilated. - Right atrium: The atrium was severely dilated. - Technically adequate study.  Assessment and Plan:  1. Chronic atrial fibrillation. Symptomatically well controlled, continue Lopressor for heart rate control. He declines anticoagulation, remains comfortable with aspirin. CHADSVASC score is 2.  2. Essential hypertension, systolic blood pressure in the 140s. Keep follow-up with Dr. Woody Seller.  3. Hyperlipidemia, remains on Mevacor.  Current medicines were reviewed with the patient today.   Orders Placed This Encounter  Procedures  . EKG 12-Lead    Disposition: Follow-up in one year.  Signed, Satira Sark, MD, [facility] 08/05/2016 9:03 AM    [facility] at [place], [place], [place] [address] Phone: [phone]; Fax: [phone]

## 2016-08-05 ENCOUNTER — Ambulatory Visit (INDEPENDENT_AMBULATORY_CARE_PROVIDER_SITE_OTHER): Payer: PPO | Admitting: Cardiology

## 2016-08-05 ENCOUNTER — Encounter: Payer: Self-pay | Admitting: Cardiology

## 2016-08-05 VITALS — BP 140/92 | HR 75 | Ht 70.0 in | Wt 199.0 lb

## 2016-08-05 DIAGNOSIS — I482 Chronic atrial fibrillation, unspecified: Secondary | ICD-10-CM

## 2016-08-05 DIAGNOSIS — I10 Essential (primary) hypertension: Secondary | ICD-10-CM | POA: Diagnosis not present

## 2016-08-05 DIAGNOSIS — E782 Mixed hyperlipidemia: Secondary | ICD-10-CM

## 2016-08-05 NOTE — Patient Instructions (Signed)

## 2016-11-10 DIAGNOSIS — H25013 Cortical age-related cataract, bilateral: Secondary | ICD-10-CM | POA: Diagnosis not present

## 2016-11-10 DIAGNOSIS — H52223 Regular astigmatism, bilateral: Secondary | ICD-10-CM | POA: Diagnosis not present

## 2016-11-10 DIAGNOSIS — H2513 Age-related nuclear cataract, bilateral: Secondary | ICD-10-CM | POA: Diagnosis not present

## 2016-11-10 DIAGNOSIS — H25813 Combined forms of age-related cataract, bilateral: Secondary | ICD-10-CM | POA: Diagnosis not present

## 2016-11-10 DIAGNOSIS — H5203 Hypermetropia, bilateral: Secondary | ICD-10-CM | POA: Diagnosis not present

## 2016-11-10 DIAGNOSIS — H524 Presbyopia: Secondary | ICD-10-CM | POA: Diagnosis not present

## 2017-01-05 DIAGNOSIS — Z23 Encounter for immunization: Secondary | ICD-10-CM | POA: Diagnosis not present

## 2017-01-05 DIAGNOSIS — Z6827 Body mass index (BMI) 27.0-27.9, adult: Secondary | ICD-10-CM | POA: Diagnosis not present

## 2017-01-05 DIAGNOSIS — I10 Essential (primary) hypertension: Secondary | ICD-10-CM | POA: Diagnosis not present

## 2017-01-05 DIAGNOSIS — I4891 Unspecified atrial fibrillation: Secondary | ICD-10-CM | POA: Diagnosis not present

## 2017-01-05 DIAGNOSIS — Z299 Encounter for prophylactic measures, unspecified: Secondary | ICD-10-CM | POA: Diagnosis not present

## 2017-01-05 DIAGNOSIS — L821 Other seborrheic keratosis: Secondary | ICD-10-CM | POA: Diagnosis not present

## 2017-01-08 DIAGNOSIS — L821 Other seborrheic keratosis: Secondary | ICD-10-CM | POA: Diagnosis not present

## 2017-01-08 DIAGNOSIS — S0001XA Abrasion of scalp, initial encounter: Secondary | ICD-10-CM | POA: Diagnosis not present

## 2017-05-06 DIAGNOSIS — Z789 Other specified health status: Secondary | ICD-10-CM | POA: Diagnosis not present

## 2017-05-06 DIAGNOSIS — M79672 Pain in left foot: Secondary | ICD-10-CM | POA: Diagnosis not present

## 2017-05-06 DIAGNOSIS — Z6828 Body mass index (BMI) 28.0-28.9, adult: Secondary | ICD-10-CM | POA: Diagnosis not present

## 2017-05-06 DIAGNOSIS — Z299 Encounter for prophylactic measures, unspecified: Secondary | ICD-10-CM | POA: Diagnosis not present

## 2017-08-07 NOTE — Progress Notes (Signed)
Cardiology Office Note  Date: 08/10/2017   ID: Douglas Lee, DOB [birth year], MRN [id number]  PCP: Douglas Chroman, MD  Primary Cardiologist: Douglas Lesches, MD   Chief Complaint  Patient presents with  . Atrial Fibrillation    History of Present Illness: Douglas Lee is a 72 y.o. male last seen in May 2018.  Presents for a routine visit.  Reports no progressive sense of palpitations or exertional chest discomfort with typical activities.  CHADSVASC score is 2.  Annual risk of stroke on aspirin is approximately 2.3% versus approximately 1% were he to switch to Xarelto.  We have discussed this and he continues to prefer aspirin at this point.  I personally reviewed his ECG today which shows rate controlled atrial fibrillation.  Brought in several copies of previous ECGs including one from [facility] when he was in sinus rhythm.  We made a copy for the chart.  Past Medical History:  Diagnosis Date  . Atrial fibrillation ([place])   . Essential hypertension   . Hyperlipemia     Past Surgical History:  Procedure Laterality Date  . KNEE ARTHROTOMY Right   . MINOR HEMORRHOIDECTOMY  07/12/2015    Current Outpatient Medications  Medication Sig Dispense Refill  . aspirin 325 MG EC tablet Take 325 mg by mouth every other day.    . Calcium Carbonate (CALCIUM 600 PO) Take 1 tablet by mouth daily.    . Cholecalciferol (VITAMIN D PO) Take 1 tablet by mouth daily.    . Coenzyme Q10 (CO Q-10 PO) Take 1 tablet by mouth daily.    . Cyanocobalamin (VITAMIN B 12 PO) Take 1,000 mcg by mouth daily.     Marland Kitchen L-LYSINE PO Take 1 tablet by mouth daily.    Marland Kitchen lisinopril (PRINIVIL,ZESTRIL) 10 MG tablet Take 10 mg by mouth daily.    Marland Kitchen lovastatin (MEVACOR) 40 MG tablet Take 40 mg by mouth at bedtime.    . metoprolol (LOPRESSOR) 100 MG tablet Take 100 mg by mouth 2 (two) times daily.    . Multiple Vitamin (MULTIVITAMIN) tablet Take 1 tablet by mouth daily.    Marland Kitchen VITAMIN E PO Take 1 tablet by mouth daily.      No current facility-administered medications for this visit.    Allergies:  Lipitor [atorvastatin]; Penicillins; and Sulfur   Social History: The patient  reports that he has never smoked. He has never used smokeless tobacco. He reports that he does not drink alcohol or use drugs.   ROS:  Please see the history of present illness. Otherwise, complete review of systems is positive for none.  All other systems are reviewed and negative.   Physical Exam: VS:  BP 122/79   Pulse 77   Ht 5\' 10"  (1.778 m)   Wt 197 lb (89.4 kg)   SpO2 97%   BMI 28.27 kg/m , BMI Body mass index is 28.27 kg/m.  Wt Readings from Last 3 Encounters:  08/10/17 197 lb (89.4 kg)  08/05/16 199 lb (90.3 kg)  08/02/15 195 lb 12.8 oz (88.8 kg)    General: Patient appears comfortable at rest. HEENT: Conjunctiva and lids normal, oropharynx clear with moist mucosa. Neck: Supple, no elevated JVP or carotid bruits, no thyromegaly. Lungs: Clear to auscultation, nonlabored breathing at rest. Cardiac: Regular rate and rhythm, no S3 or significant systolic murmur, no pericardial rub. Abdomen: Soft, nontender, bowel sounds present. Extremities: No pitting edema, distal pulses 2+.  ECG: I personally reviewed the tracing from 08/05/2016  which shows rate controlled atrial fibrillation.  Other Studies Reviewed Today:  Echocardiogram 08/09/2015: Study Conclusions  - Left ventricle: The cavity size was normal. Wall thickness was increased in a pattern of mild LVH. Systolic function was normal. The estimated ejection fraction was in the range of 60% to 65%. Wall motion was normal; there were no regional wall motion abnormalities. - Aortic valve: Mildly calcified annulus. Trileaflet; mildly thickened leaflets. Valve area (VTI): 2.24 cm^2. Valve area (Vmax): 1.93 cm^2. Valve area (Vmean): 2.14 cm^2. - Mitral valve: Mildly calcified annulus. Normal thickness leaflets. - Left atrium: The atrium was severely  dilated. - Right ventricle: The cavity size was mildly dilated. - Right atrium: The atrium was severely dilated. - Technically adequate study.  Assessment and Plan:  1.  Chronic atrial fibrillation.  He reports no significant palpitations on medical therapy including Lopressor.  As noted above he declines anticoagulation with CHADVASC score of 2 and is taking an aspirin at this point.  2.  Essential hypertension, follows with Dr. Woody Seller.  Blood pressure is well controlled today.  Current medicines were reviewed with the patient today.   Orders Placed This Encounter  Procedures  . EKG 12-Lead    Disposition: Follow-up in 1 year, sooner if needed.  Signed, Satira Sark, MD, [facility] 08/10/2017 9:49 AM    [facility] [place], [place], [place] [address] Phone: [phone]; Fax: [phone]

## 2017-08-10 ENCOUNTER — Ambulatory Visit: Payer: PPO | Admitting: Cardiology

## 2017-08-10 ENCOUNTER — Encounter: Payer: Self-pay | Admitting: Cardiology

## 2017-08-10 VITALS — BP 122/79 | HR 77 | Ht 70.0 in | Wt 197.0 lb

## 2017-08-10 DIAGNOSIS — I10 Essential (primary) hypertension: Secondary | ICD-10-CM | POA: Diagnosis not present

## 2017-08-10 DIAGNOSIS — I482 Chronic atrial fibrillation, unspecified: Secondary | ICD-10-CM

## 2017-08-10 NOTE — Patient Instructions (Signed)

## 2017-10-05 DIAGNOSIS — Z8673 Personal history of transient ischemic attack (TIA), and cerebral infarction without residual deficits: Secondary | ICD-10-CM

## 2017-10-05 HISTORY — DX: Personal history of transient ischemic attack (TIA), and cerebral infarction without residual deficits: Z86.73

## 2017-10-17 ENCOUNTER — Encounter: Payer: Self-pay | Admitting: Cardiology

## 2017-10-17 DIAGNOSIS — W01190A Fall on same level from slipping, tripping and stumbling with subsequent striking against furniture, initial encounter: Secondary | ICD-10-CM | POA: Diagnosis not present

## 2017-10-17 DIAGNOSIS — R0902 Hypoxemia: Secondary | ICD-10-CM | POA: Diagnosis not present

## 2017-10-17 DIAGNOSIS — S299XXA Unspecified injury of thorax, initial encounter: Secondary | ICD-10-CM | POA: Diagnosis not present

## 2017-10-17 DIAGNOSIS — I4891 Unspecified atrial fibrillation: Secondary | ICD-10-CM | POA: Diagnosis not present

## 2017-10-17 DIAGNOSIS — G459 Transient cerebral ischemic attack, unspecified: Secondary | ICD-10-CM | POA: Diagnosis not present

## 2017-10-17 DIAGNOSIS — E7800 Pure hypercholesterolemia, unspecified: Secondary | ICD-10-CM | POA: Diagnosis not present

## 2017-10-17 DIAGNOSIS — I959 Hypotension, unspecified: Secondary | ICD-10-CM | POA: Diagnosis not present

## 2017-10-17 DIAGNOSIS — R0789 Other chest pain: Secondary | ICD-10-CM | POA: Diagnosis not present

## 2017-10-17 DIAGNOSIS — R531 Weakness: Secondary | ICD-10-CM | POA: Diagnosis not present

## 2017-10-17 DIAGNOSIS — I10 Essential (primary) hypertension: Secondary | ICD-10-CM | POA: Diagnosis not present

## 2017-10-17 DIAGNOSIS — Z79899 Other long term (current) drug therapy: Secondary | ICD-10-CM | POA: Diagnosis not present

## 2017-10-19 DIAGNOSIS — I10 Essential (primary) hypertension: Secondary | ICD-10-CM | POA: Diagnosis not present

## 2017-10-19 DIAGNOSIS — Z6827 Body mass index (BMI) 27.0-27.9, adult: Secondary | ICD-10-CM | POA: Diagnosis not present

## 2017-10-19 DIAGNOSIS — I4891 Unspecified atrial fibrillation: Secondary | ICD-10-CM | POA: Diagnosis not present

## 2017-10-19 DIAGNOSIS — R29898 Other symptoms and signs involving the musculoskeletal system: Secondary | ICD-10-CM | POA: Diagnosis not present

## 2017-10-19 DIAGNOSIS — R030 Elevated blood-pressure reading, without diagnosis of hypertension: Secondary | ICD-10-CM | POA: Diagnosis not present

## 2017-10-22 DIAGNOSIS — R279 Unspecified lack of coordination: Secondary | ICD-10-CM | POA: Diagnosis not present

## 2017-10-22 DIAGNOSIS — I4891 Unspecified atrial fibrillation: Secondary | ICD-10-CM | POA: Diagnosis not present

## 2017-10-22 DIAGNOSIS — I10 Essential (primary) hypertension: Secondary | ICD-10-CM | POA: Diagnosis not present

## 2017-10-22 DIAGNOSIS — Z299 Encounter for prophylactic measures, unspecified: Secondary | ICD-10-CM | POA: Diagnosis not present

## 2017-10-22 DIAGNOSIS — I639 Cerebral infarction, unspecified: Secondary | ICD-10-CM | POA: Diagnosis not present

## 2017-10-22 DIAGNOSIS — Z6827 Body mass index (BMI) 27.0-27.9, adult: Secondary | ICD-10-CM | POA: Diagnosis not present

## 2017-10-22 DIAGNOSIS — R5383 Other fatigue: Secondary | ICD-10-CM | POA: Diagnosis not present

## 2017-10-22 DIAGNOSIS — M6281 Muscle weakness (generalized): Secondary | ICD-10-CM | POA: Diagnosis not present

## 2017-10-23 ENCOUNTER — Telehealth: Payer: Self-pay | Admitting: Cardiology

## 2017-10-23 ENCOUNTER — Encounter: Payer: Self-pay | Admitting: Diagnostic Neuroimaging

## 2017-10-23 ENCOUNTER — Ambulatory Visit (INDEPENDENT_AMBULATORY_CARE_PROVIDER_SITE_OTHER): Payer: PPO | Admitting: Diagnostic Neuroimaging

## 2017-10-23 VITALS — BP 135/87 | HR 72 | Ht 70.0 in | Wt 189.4 lb

## 2017-10-23 DIAGNOSIS — I63411 Cerebral infarction due to embolism of right middle cerebral artery: Secondary | ICD-10-CM

## 2017-10-23 DIAGNOSIS — I482 Chronic atrial fibrillation, unspecified: Secondary | ICD-10-CM

## 2017-10-23 MED ORDER — APIXABAN 5 MG PO TABS: 5.0000 mg | ORAL_TABLET | Freq: Two times a day (BID) | ORAL | 6 refills | Status: DC

## 2017-10-23 NOTE — Telephone Encounter (Signed)
Yes, anticoagulation would be indicated, particularly if he has had an interval stroke. I last saw him in May at which point I recommended anticoagulation based on CHADSVASC score, although he declined. If he is now willing to start we can follow through our anticoagulation clinic.

## 2017-10-23 NOTE — Telephone Encounter (Signed)
New Message    Christine with [facility] is calling on behalf of Dr. Leta Baptist. The provider is requesting that the patient starts an anticoagulant due to a recent stroke. They are wanting to know if that is something that Dr. Domenic Polite recommends and can start the patient on. Please call to discuss.

## 2017-10-23 NOTE — Telephone Encounter (Signed)
Per Dr. Domenic Polite last office note (08-10-2017) patient did not want anticoagulation at that time.    CHADSVASC score is 2.  Annual risk of stroke on aspirin is approximately 2.3% versus approximately 1% were he to switch to Xarelto.  We have discussed this and he continues to prefer aspirin at this point.  Will forward message to provider for his review.

## 2017-10-23 NOTE — Patient Instructions (Signed)
-   recommend to start eliquis or xarelto (patient will discuss with Dr. Domenic Polite); then he should stop aspirin / plavix  - continue lovastatin, metoprolol, lisinopril  - follow up carotid u/s and echocardiogram results  - improve nutrition / exercise reviewed

## 2017-10-23 NOTE — Progress Notes (Signed)
[facility]  PATIENT: Douglas Lee DOB: February 28, 1946  REFERRING CLINICIAN: D Vyas HISTORY FROM: patient, wife, chart review REASON FOR VISIT: new consult    HISTORICAL  CHIEF COMPLAINT:  Chief Complaint  Patient presents with  . New Patient (Initial Visit)    RM 6, with wife. Per pt, had an MRI which showed several strokes. Weakness on L arm and L leg on Saturday, was transported to [facility]. Dr. Woody Seller' office.     HISTORY OF PRESENT ILLNESS:   72 year old male with hypertension, hyperglycemia, atrial for ablation, here for evaluation of stroke.  10/17/2017 patient was at [facility] function, had 6 tenderloin biscuit sandwiches, then had sudden onset of left-sided weakness.  He felt left leg weakness initially and then left arm weakness.  Patient fell down in some chairs.  Initially bystanders thought he was joking around but then realized he was having some problem.  They called paramedics who came to scene.  Patient's blood pressure was elevated 198/98.  He had left arm drift on exam.  Left-sided weakness symptoms resolved within about 15 minutes.  No slurred speech.  No facial weakness or numbness.  No vision loss.  No headaches.    Patient was taken to [facility].  I do not have these records to review myself but according to patient he was evaluated for chest pain problems and falling down.  Patient was then discharged home.  Patient went to PCP on Monday, and concern of possible TIA versus stroke was raised.  Therefore MRI of the brain was obtained which showed multiple embolic strokes in the right parietal lobe and insular region.  Therefore patient referred here for urgent consultation today.  Since that time patient is doing well.  No residual numbness or weakness symptoms.  Patient was previously on aspirin 325 mg/day.  After MRI showed acute strokes he was switched to aspirin 81 mg plus Plavix and 5 mill grams per day.  I reviewed  primary care notes as above.  Also I reviewed cardiology notes which mention that patient was offered to start anticoagulation several times in the past for atrial fibrillation but he preferred to stay on aspirin.    REVIEW OF SYSTEMS: Full 14 system review of systems performed and negative with exception of: Snoring allergies.  ALLERGIES: Allergies  Allergen Reactions  . Lipitor [Atorvastatin] Other (See Comments)  . Penicillins   . Sulfur     HOME MEDICATIONS: Outpatient Medications Prior to Visit  Medication Sig Dispense Refill  . aspirin EC 81 MG tablet Take 81 mg by mouth daily.    . Calcium Carbonate (CALCIUM 600 PO) Take 1 tablet by mouth daily.    . Cholecalciferol (VITAMIN D PO) Take 1 tablet by mouth daily.    . clopidogrel (PLAVIX) 75 MG tablet Take 75 mg by mouth daily.    . Coenzyme Q10 (CO Q-10 PO) Take 1 tablet by mouth daily.    Marland Kitchen L-LYSINE PO Take 1 tablet by mouth daily.    Marland Kitchen lisinopril (PRINIVIL,ZESTRIL) 10 MG tablet Take 10 mg by mouth daily.    Marland Kitchen lovastatin (MEVACOR) 40 MG tablet Take 40 mg by mouth at bedtime.    . metoprolol (LOPRESSOR) 100 MG tablet Take 100 mg by mouth 2 (two) times daily.    . Multiple Vitamin (MULTIVITAMIN) tablet Take 1 tablet by mouth daily.    Marland Kitchen VITAMIN E PO Take 1 tablet by mouth daily.    Marland Kitchen aspirin 325 MG EC  tablet Take 325 mg by mouth every other day.    . Cyanocobalamin (VITAMIN B 12 PO) Take 1,000 mcg by mouth daily.      No facility-administered medications prior to visit.     PAST MEDICAL HISTORY: Past Medical History:  Diagnosis Date  . Atrial fibrillation ([place])   . Essential hypertension   . Hyperlipemia     PAST SURGICAL HISTORY: Past Surgical History:  Procedure Laterality Date  . KNEE ARTHROTOMY Right   . MINOR HEMORRHOIDECTOMY  07/12/2015    FAMILY HISTORY: Family History  Problem Relation Age of Onset  . Heart attack Father 69  . Stroke Mother 68  . Hypertension Unknown   . Arrhythmia Brother        PPM      SOCIAL HISTORY:  Social History   Socioeconomic History  . Marital status: Married    Spouse name: Not on file  . Number of children: Not on file  . Years of education: Not on file  . Highest education level: Not on file  Occupational History  . Not on file  Social Needs  . Financial resource strain: Not on file  . Food insecurity:    Worry: Not on file    Inability: Not on file  . Transportation needs:    Medical: Not on file    Non-medical: Not on file  Tobacco Use  . Smoking status: Never Smoker  . Smokeless tobacco: Never Used  Substance and Sexual Activity  . Alcohol use: No    Alcohol/week: 0.0 oz  . Drug use: No  . Sexual activity: Not on file  Lifestyle  . Physical activity:    Days per week: Not on file    Minutes per session: Not on file  . Stress: Not on file  Relationships  . Social connections:    Talks on phone: Not on file    Gets together: Not on file    Attends religious service: Not on file    Active member of club or organization: Not on file    Attends meetings of clubs or organizations: Not on file    Relationship status: Not on file  . Intimate partner violence:    Fear of current or ex partner: Not on file    Emotionally abused: Not on file    Physically abused: Not on file    Forced sexual activity: Not on file  Other Topics Concern  . Not on file  Social History Narrative  . Not on file     PHYSICAL EXAM  GENERAL EXAM/CONSTITUTIONAL: Vitals:  Vitals:   10/23/17 1053  BP: 135/87  Pulse: 72  Weight: 189 lb 6.4 oz (85.9 kg)  Height: 5\' 10"  (1.778 m)     Body mass index is 27.18 kg/m.  No exam data present  Patient is in no distress; well developed, nourished and groomed; neck is supple  CARDIOVASCULAR:  Examination of carotid arteries is normal; no carotid bruits  IRREGULAR RATE AND RHYTHM, no murmurs  Examination of peripheral vascular system by observation and palpation is normal  EYES:  Ophthalmoscopic  exam of optic discs and posterior segments is normal; no papilledema or hemorrhages  MUSCULOSKELETAL:  Gait, strength, tone, movements noted in Neurologic exam below  NEUROLOGIC: MENTAL STATUS:  No flowsheet data found.  awake, alert, oriented to person, place and time  recent and remote memory intact  normal attention and concentration  language fluent, comprehension intact, naming intact,   fund of knowledge appropriate  CRANIAL NERVE:   2nd - no papilledema on fundoscopic exam  2nd, 3rd, 4th, 6th - pupils equal and reactive to light, visual fields full to confrontation, extraocular muscles intact, no nystagmus  5th - facial sensation symmetric  7th - facial strength symmetric  8th - hearing intact  9th - palate elevates symmetrically, uvula midline  11th - shoulder shrug symmetric  12th - tongue protrusion midline  MOTOR:   normal bulk and tone, full strength in the BUE, BLE  SENSORY:   normal and symmetric to light touch, temperature, vibration; NO EXTINCTION; NO NEGLECT  COORDINATION:   finger-nose-finger, fine finger movements normal  REFLEXES:   deep tendon reflexes present and symmetric  GAIT/STATION:   narrow based gait; able to walk tandem; romberg is negative    DIAGNOSTIC DATA (LABS, IMAGING, TESTING) - I reviewed patient records, labs, notes, testing and imaging myself where available.  No results found for: WBC, HGB, HCT, MCV, PLT No results found for: NA, K, CL, CO2, GLUCOSE, BUN, CREATININE, CALCIUM, PROT, ALBUMIN, AST, ALT, ALKPHOS, BILITOT, GFRNONAA, GFRAA No results found for: CHOL, HDL, LDLCALC, LDLDIRECT, TRIG, CHOLHDL No results found for: HGBA1C No results found for: VITAMINB12 No results found for: TSH   08/09/15 TTE - Left ventricle: The cavity size was normal. Wall thickness was increased in a pattern of mild LVH. Systolic function was normal.   The estimated ejection fraction was in the range of 60% to 65%.   Wall  motion was normal; there were no regional wall motion   abnormalities. - Aortic valve: Mildly calcified annulus. Trileaflet; mildly   thickened leaflets. Valve area (VTI): 2.24 cm^2. Valve area   (Vmax): 1.93 cm^2. Valve area (Vmean): 2.14 cm^2. - Mitral valve: Mildly calcified annulus. Normal thickness leaflets. - Left atrium: The atrium was severely dilated. - Right ventricle: The cavity size was mildly dilated. - Right atrium: The atrium was severely dilated. - Technically adequate study.  08/09/15 MRI brain [I reviewed images myself and agree with interpretation. Acute embolic strokes noted in right parietal lobe. -VRP]  1. Acute nonhemorrhagic infarcts involving the posterior and inferior right insular cortex and scattered areas within the right parietal lobe. These correspond with the patient's acute symptoms. 2. Atrophy and white matter changes are moderately advanced for age. This likely reflects the sequela of chronic microvascular ischemia. 3. Remote lacunar infarcts of the cerebellum bilaterally.     ASSESSMENT AND PLAN  72 y.o. year old male here with hypertension, hypercholesterolemia, atrial fibrillation, now with right brain ischemic infarctions (parietal lobe), embolic, likely due to atrial fibrillation.   Dx: embolic strokes (likely due to atrial fibrillation)  1. Cerebrovascular accident (CVA) due to embolism of right middle cerebral artery (HCC)      PLAN:  EMBOLIC STROKES DUE TO ATRIAL FIBRILLATION - start eliquis 5mg  twice a day and then stop aspirin / plavix; patient will follow up and discuss with Dr. Domenic Polite (cardiology) - continue lovastatin, metoprolol, lisinopril - follow up carotid u/s and TTE results (ordered by PCP; scheduled for Monday) - nutrition / exercise strategies reviewed - consider sleep study  Meds ordered this encounter  Medications  . apixaban (ELIQUIS) 5 MG TABS tablet    Sig: Take 1 tablet (5 mg total) by mouth 2 (two) times daily.      Dispense:  60 tablet    Refill:  6   Return in about 6 months (around 04/25/2018).  I reviewed images, labs, notes, records myself. I summarized findings and reviewed with  patient, for this high risk condition (subacute stroke; atrial fibrillation) requiring high complexity decision making.     Penni Bombard, MD 3/41/9379, 02:40 AM Certified in Neurology, Neurophysiology and Neuroimaging  [facility] [address], [place], [place] [address] [phone]

## 2017-10-26 ENCOUNTER — Encounter: Payer: Self-pay | Admitting: Cardiology

## 2017-10-26 DIAGNOSIS — I4891 Unspecified atrial fibrillation: Secondary | ICD-10-CM | POA: Diagnosis not present

## 2017-10-26 DIAGNOSIS — I639 Cerebral infarction, unspecified: Secondary | ICD-10-CM | POA: Diagnosis not present

## 2017-11-02 NOTE — Telephone Encounter (Signed)
Patient informed by Annabell Sabal

## 2017-11-12 ENCOUNTER — Ambulatory Visit (INDEPENDENT_AMBULATORY_CARE_PROVIDER_SITE_OTHER): Payer: PPO | Admitting: *Deleted

## 2017-11-12 DIAGNOSIS — I63411 Cerebral infarction due to embolism of right middle cerebral artery: Secondary | ICD-10-CM | POA: Diagnosis not present

## 2017-11-12 DIAGNOSIS — I4891 Unspecified atrial fibrillation: Secondary | ICD-10-CM

## 2017-11-12 DIAGNOSIS — Z5181 Encounter for therapeutic drug level monitoring: Secondary | ICD-10-CM | POA: Diagnosis not present

## 2017-11-12 NOTE — Progress Notes (Signed)
Pt was started on Eliquis 5mg  bid for atrial fibrillation on 10/23/17 by .    Reviewed patients medication list.  Pt is not currently on any combined P-gp and strong CYP3A4 inhibitors/inducers (ketoconazole, traconazole, ritonavir, carbamazepine, phenytoin, rifampin, St. John's wort).  Reviewed labs 11/16/17.  SCr 1.13, Weight 86kg, CrCl 70.82.  Dose is appropriate based on age, weight, and SCr.  Hgb and HCT: 14.7/44.0  A full discussion of the nature of anticoagulants has been carried out.  A benefit/risk analysis has been presented to the patient, so that they understand the justification for choosing anticoagulation with Eliquis at this time.  The need for compliance is stressed.  Pt is aware to take the medication twice daily.  Side effects of potential bleeding are discussed, including unusual colored urine or stools, coughing up blood or coffee ground emesis, nose bleeds or serious fall or head trauma.  Discussed signs and symptoms of stroke. The patient should avoid any OTC items containing aspirin or ibuprofen.  Avoid alcohol consumption.   Call if any signs of abnormal bleeding.  Discussed financial obligations and resolved any difficulty in obtaining medication.  Next lab test in 6 months.   Reviewed labs from [facility] R 07/19 admission and new labs drawn on 11/16/17 and discussed with pt.  6 month appt made for 05/2018

## 2017-11-23 ENCOUNTER — Encounter: Payer: Self-pay | Admitting: *Deleted

## 2017-11-23 DIAGNOSIS — Z6827 Body mass index (BMI) 27.0-27.9, adult: Secondary | ICD-10-CM | POA: Diagnosis not present

## 2017-11-23 DIAGNOSIS — I63411 Cerebral infarction due to embolism of right middle cerebral artery: Secondary | ICD-10-CM | POA: Diagnosis not present

## 2017-11-23 DIAGNOSIS — Z5181 Encounter for therapeutic drug level monitoring: Secondary | ICD-10-CM | POA: Diagnosis not present

## 2017-11-23 DIAGNOSIS — I10 Essential (primary) hypertension: Secondary | ICD-10-CM | POA: Diagnosis not present

## 2017-11-23 DIAGNOSIS — E7800 Pure hypercholesterolemia, unspecified: Secondary | ICD-10-CM | POA: Diagnosis not present

## 2017-11-23 DIAGNOSIS — I4891 Unspecified atrial fibrillation: Secondary | ICD-10-CM | POA: Diagnosis not present

## 2017-11-23 DIAGNOSIS — Z299 Encounter for prophylactic measures, unspecified: Secondary | ICD-10-CM | POA: Diagnosis not present

## 2017-11-26 ENCOUNTER — Telehealth: Payer: Self-pay | Admitting: *Deleted

## 2017-11-30 NOTE — Telephone Encounter (Signed)
Results sent to Edrick Oh.

## 2017-12-15 DIAGNOSIS — Z789 Other specified health status: Secondary | ICD-10-CM | POA: Diagnosis not present

## 2017-12-15 DIAGNOSIS — Z299 Encounter for prophylactic measures, unspecified: Secondary | ICD-10-CM | POA: Diagnosis not present

## 2017-12-15 DIAGNOSIS — Z6826 Body mass index (BMI) 26.0-26.9, adult: Secondary | ICD-10-CM | POA: Diagnosis not present

## 2017-12-15 DIAGNOSIS — I4891 Unspecified atrial fibrillation: Secondary | ICD-10-CM | POA: Diagnosis not present

## 2017-12-15 DIAGNOSIS — I10 Essential (primary) hypertension: Secondary | ICD-10-CM | POA: Diagnosis not present

## 2017-12-15 DIAGNOSIS — R21 Rash and other nonspecific skin eruption: Secondary | ICD-10-CM | POA: Diagnosis not present

## 2018-01-07 DIAGNOSIS — Z1331 Encounter for screening for depression: Secondary | ICD-10-CM | POA: Diagnosis not present

## 2018-01-07 DIAGNOSIS — Z0000 Encounter for general adult medical examination without abnormal findings: Secondary | ICD-10-CM | POA: Diagnosis not present

## 2018-01-07 DIAGNOSIS — I4891 Unspecified atrial fibrillation: Secondary | ICD-10-CM | POA: Diagnosis not present

## 2018-01-07 DIAGNOSIS — I10 Essential (primary) hypertension: Secondary | ICD-10-CM | POA: Diagnosis not present

## 2018-01-07 DIAGNOSIS — Z7189 Other specified counseling: Secondary | ICD-10-CM | POA: Diagnosis not present

## 2018-01-07 DIAGNOSIS — Z1211 Encounter for screening for malignant neoplasm of colon: Secondary | ICD-10-CM | POA: Diagnosis not present

## 2018-01-07 DIAGNOSIS — E781 Pure hyperglyceridemia: Secondary | ICD-10-CM | POA: Diagnosis not present

## 2018-01-07 DIAGNOSIS — Z79899 Other long term (current) drug therapy: Secondary | ICD-10-CM | POA: Diagnosis not present

## 2018-01-07 DIAGNOSIS — Z1339 Encounter for screening examination for other mental health and behavioral disorders: Secondary | ICD-10-CM | POA: Diagnosis not present

## 2018-01-07 DIAGNOSIS — Z299 Encounter for prophylactic measures, unspecified: Secondary | ICD-10-CM | POA: Diagnosis not present

## 2018-01-07 DIAGNOSIS — Z6826 Body mass index (BMI) 26.0-26.9, adult: Secondary | ICD-10-CM | POA: Diagnosis not present

## 2018-04-15 DIAGNOSIS — E7800 Pure hypercholesterolemia, unspecified: Secondary | ICD-10-CM | POA: Diagnosis not present

## 2018-04-15 DIAGNOSIS — I10 Essential (primary) hypertension: Secondary | ICD-10-CM | POA: Diagnosis not present

## 2018-04-15 DIAGNOSIS — I63411 Cerebral infarction due to embolism of right middle cerebral artery: Secondary | ICD-10-CM | POA: Diagnosis not present

## 2018-04-15 DIAGNOSIS — Z6827 Body mass index (BMI) 27.0-27.9, adult: Secondary | ICD-10-CM | POA: Diagnosis not present

## 2018-04-15 DIAGNOSIS — Z299 Encounter for prophylactic measures, unspecified: Secondary | ICD-10-CM | POA: Diagnosis not present

## 2018-04-15 DIAGNOSIS — I4891 Unspecified atrial fibrillation: Secondary | ICD-10-CM | POA: Diagnosis not present

## 2018-04-27 ENCOUNTER — Encounter: Payer: Self-pay | Admitting: Diagnostic Neuroimaging

## 2018-04-27 ENCOUNTER — Ambulatory Visit: Payer: PPO | Admitting: Diagnostic Neuroimaging

## 2018-04-27 VITALS — BP 151/91 | HR 73 | Ht 70.0 in | Wt 193.6 lb

## 2018-04-27 DIAGNOSIS — R0683 Snoring: Secondary | ICD-10-CM

## 2018-04-27 DIAGNOSIS — I482 Chronic atrial fibrillation, unspecified: Secondary | ICD-10-CM | POA: Diagnosis not present

## 2018-04-27 DIAGNOSIS — I63411 Cerebral infarction due to embolism of right middle cerebral artery: Secondary | ICD-10-CM

## 2018-04-27 NOTE — Patient Instructions (Signed)
EMBOLIC STROKES DUE TO ATRIAL FIBRILLATION - continue eliquis 5mg  twice a day; follow up with Dr. Domenic Polite and Dr. Woody Seller - continue lovastatin, metoprolol, lisinopril - nutrition / exercise strategies reviewed  SNORING / HYPERTENSION - check sleep study to rule out sleep apnea

## 2018-04-27 NOTE — Progress Notes (Signed)
[facility]  PATIENT: Douglas Lee DOB: [birth year]  REFERRING CLINICIAN: D Vyas HISTORY FROM: patient and chart review REASON FOR VISIT: follow up   HISTORICAL  CHIEF COMPLAINT:  Chief Complaint  Patient presents with  . Follow-up    Rm 6, alone  . Cerebrovascular Accident    has been doing ok, tolerating eliquis ok.    HISTORY OF PRESENT ILLNESS:   UPDATE (04/27/18, VRP): Since last visit, doing well. Symptoms are stable. No alleviating or aggravating factors. Tolerating eliquis (now being rx'd by PCP).  PRIOR HPI (10/23/17): 73 year old male with hypertension, hyperglycemia, atrial for ablation, here for evaluation of stroke.  10/17/2017 patient was at [facility] function, had 6 tenderloin biscuit sandwiches, then had sudden onset of left-sided weakness.  He felt left leg weakness initially and then left arm weakness.  Patient fell down in some chairs.  Initially bystanders thought he was joking around but then realized he was having some problem.  They called paramedics who came to scene.  Patient's blood pressure was elevated 198/98.  He had left arm drift on exam.  Left-sided weakness symptoms resolved within about 15 minutes.  No slurred speech.  No facial weakness or numbness.  No vision loss.  No headaches.  Patient was taken to [facility].  I do not have these records to review myself but according to patient he was evaluated for chest pain problems and falling down.  Patient was then discharged home.  Patient went to PCP on Monday, and concern of possible TIA versus stroke was raised.  Therefore MRI of the brain was obtained which showed multiple embolic strokes in the right parietal lobe and insular region.  Therefore patient referred here for urgent consultation today.  Since that time patient is doing well.  No residual numbness or weakness symptoms.  Patient was previously on aspirin 325 mg/day. After MRI showed acute strokes he was  switched to aspirin 81 mg plus Plavix 75 mg per day.  I reviewed primary care notes as above.  Also I reviewed cardiology notes which mention that patient was offered to start anticoagulation several times in the past for atrial fibrillation but he preferred to stay on aspirin.   REVIEW OF SYSTEMS: Full 14 system review of systems performed and negative with exception of: snoring.   ALLERGIES: Allergies  Allergen Reactions  . Lipitor [Atorvastatin] Other (See Comments)  . Penicillins   . Sulfur     HOME MEDICATIONS: Outpatient Medications Prior to Visit  Medication Sig Dispense Refill  . apixaban (ELIQUIS) 5 MG TABS tablet Take 1 tablet (5 mg total) by mouth 2 (two) times daily. 60 tablet 6  . Calcium Carbonate (CALCIUM 600 PO) Take 1 tablet by mouth daily.    . Cholecalciferol (VITAMIN D PO) Take 1 tablet by mouth daily.    . Coenzyme Q10 (CO Q-10 PO) Take 1 tablet by mouth daily.    Marland Kitchen L-LYSINE PO Take 1 tablet by mouth daily.    Marland Kitchen lisinopril (PRINIVIL,ZESTRIL) 10 MG tablet Take 10 mg by mouth daily.    Marland Kitchen lovastatin (MEVACOR) 40 MG tablet Take 40 mg by mouth at bedtime.    . metoprolol (LOPRESSOR) 100 MG tablet Take 100 mg by mouth 2 (two) times daily.    . Multiple Vitamin (MULTIVITAMIN) tablet Take 1 tablet by mouth daily.    Marland Kitchen VITAMIN E PO Take 1 tablet by mouth daily.     No facility-administered medications prior to visit.  PAST MEDICAL HISTORY: Past Medical History:  Diagnosis Date  . Atrial fibrillation ([place])   . Essential hypertension   . Hyperlipemia     PAST SURGICAL HISTORY: Past Surgical History:  Procedure Laterality Date  . KNEE ARTHROTOMY Right   . MINOR HEMORRHOIDECTOMY  07/12/2015    FAMILY HISTORY: Family History  Problem Relation Age of Onset  . Heart attack Father 3  . Stroke Mother 60  . Hypertension Unknown   . Arrhythmia Brother        PPM    SOCIAL HISTORY:  Social History   Socioeconomic History  . Marital status: Married     Spouse name: Not on file  . Number of children: Not on file  . Years of education: Not on file  . Highest education level: Not on file  Occupational History  . Not on file  Social Needs  . Financial resource strain: Not on file  . Food insecurity:    Worry: Not on file    Inability: Not on file  . Transportation needs:    Medical: Not on file    Non-medical: Not on file  Tobacco Use  . Smoking status: Never Smoker  . Smokeless tobacco: Never Used  Substance and Sexual Activity  . Alcohol use: No    Alcohol/week: 0.0 standard drinks  . Drug use: No  . Sexual activity: Not on file  Lifestyle  . Physical activity:    Days per week: Not on file    Minutes per session: Not on file  . Stress: Not on file  Relationships  . Social connections:    Talks on phone: Not on file    Gets together: Not on file    Attends religious service: Not on file    Active member of club or organization: Not on file    Attends meetings of clubs or organizations: Not on file    Relationship status: Not on file  . Intimate partner violence:    Fear of current or ex partner: Not on file    Emotionally abused: Not on file    Physically abused: Not on file    Forced sexual activity: Not on file  Other Topics Concern  . Not on file  Social History Narrative  . Not on file     PHYSICAL EXAM  GENERAL EXAM/CONSTITUTIONAL: Vitals:  Vitals:   04/27/18 1017  BP: (!) 151/91  Pulse: 73  Weight: 193 lb 9.6 oz (87.8 kg)  Height: 5\' 10"  (1.778 m)   Body mass index is 27.78 kg/m. No exam data present  Patient is in no distress; well developed, nourished and groomed; neck is supple  CARDIOVASCULAR:  Examination of carotid arteries is normal; no carotid bruits  IRREGULAR RATE AND RHYTHM, no murmurs  Examination of peripheral vascular system by observation and palpation is normal  EYES:  Ophthalmoscopic exam of optic discs and posterior segments is normal; no papilledema or  hemorrhages  MUSCULOSKELETAL:  Gait, strength, tone, movements noted in Neurologic exam below  NEUROLOGIC: MENTAL STATUS:  No flowsheet data found.  awake, alert, oriented to person, place and time  recent and remote memory intact  normal attention and concentration  language fluent, comprehension intact, naming intact,   fund of knowledge appropriate  CRANIAL NERVE:   2nd - no papilledema on fundoscopic exam  2nd, 3rd, 4th, 6th - pupils equal and reactive to light, visual fields full to confrontation, extraocular muscles intact, no nystagmus  5th - facial sensation symmetric  7th - facial strength symmetric  8th - hearing intact  9th - palate elevates symmetrically, uvula midline  11th - shoulder shrug symmetric  12th - tongue protrusion midline  MOTOR:   normal bulk and tone, full strength in the BUE, BLE  SENSORY:   normal and symmetric to light touch  COORDINATION:   finger-nose-finger, fine finger movements normal  REFLEXES:   deep tendon reflexes present and symmetric  GAIT/STATION:   narrow based gait    DIAGNOSTIC DATA (LABS, IMAGING, TESTING) - I reviewed patient records, labs, notes, testing and imaging myself where available.  No results found for: WBC, HGB, HCT, MCV, PLT No results found for: NA, K, CL, CO2, GLUCOSE, BUN, CREATININE, CALCIUM, PROT, ALBUMIN, AST, ALT, ALKPHOS, BILITOT, GFRNONAA, GFRAA No results found for: CHOL, HDL, LDLCALC, LDLDIRECT, TRIG, CHOLHDL No results found for: HGBA1C No results found for: VITAMINB12 No results found for: TSH   08/09/15 TTE - Left ventricle: The cavity size was normal. Wall thickness was increased in a pattern of mild LVH. Systolic function was normal.   The estimated ejection fraction was in the range of 60% to 65%.   Wall motion was normal; there were no regional wall motion   abnormalities. - Aortic valve: Mildly calcified annulus. Trileaflet; mildly   thickened leaflets. Valve area  (VTI): 2.24 cm^2. Valve area   (Vmax): 1.93 cm^2. Valve area (Vmean): 2.14 cm^2. - Mitral valve: Mildly calcified annulus. Normal thickness leaflets. - Left atrium: The atrium was severely dilated. - Right ventricle: The cavity size was mildly dilated. - Right atrium: The atrium was severely dilated. - Technically adequate study.  08/09/15 MRI brain [I reviewed images myself and agree with interpretation. Acute embolic strokes noted in right parietal lobe. -VRP]  1. Acute nonhemorrhagic infarcts involving the posterior and inferior right insular cortex and scattered areas within the right parietal lobe. These correspond with the patient's acute symptoms. 2. Atrophy and white matter changes are moderately advanced for age. This likely reflects the sequela of chronic microvascular ischemia. 3. Remote lacunar infarcts of the cerebellum bilaterally.  10/26/17 TTE - EF 60-65% - no source of emboli  10/26/17 carotid u/s  - no stenosis    ASSESSMENT AND PLAN  73 y.o. year old male here with hypertension, hypercholesterolemia, atrial fibrillation, now with right brain ischemic infarctions (parietal lobe), embolic, likely due to atrial fibrillation.  Dx: embolic strokes (likely due to atrial fibrillation)  1. Cerebrovascular accident (CVA) due to embolism of right middle cerebral artery ([place])   2. Chronic atrial fibrillation      PLAN:  EMBOLIC STROKES DUE TO ATRIAL FIBRILLATION - continue eliquis 5mg  twice a day; follow up with Dr. Domenic Polite and PCP - continue lovastatin, metoprolol, lisinopril - nutrition / exercise strategies reviewed  SNORING / HYPERTENSION - check sleep study to rule out OSA  Orders Placed This Encounter  Procedures  . Ambulatory referral to Sleep Studies   Return for return to PCP, pending if symptoms worsen or fail to improve.    Penni Bombard, MD 11/17/7515, 00:17 AM Certified in Neurology, Neurophysiology and Neuroimaging  [facility] [address], [place], [place] [address] [phone]

## 2018-05-08 ENCOUNTER — Encounter: Payer: Self-pay | Admitting: Cardiology

## 2018-05-27 DIAGNOSIS — I639 Cerebral infarction, unspecified: Secondary | ICD-10-CM | POA: Diagnosis not present

## 2018-05-27 DIAGNOSIS — I10 Essential (primary) hypertension: Secondary | ICD-10-CM | POA: Diagnosis not present

## 2018-05-27 DIAGNOSIS — J111 Influenza due to unidentified influenza virus with other respiratory manifestations: Secondary | ICD-10-CM | POA: Diagnosis not present

## 2018-05-27 DIAGNOSIS — Z299 Encounter for prophylactic measures, unspecified: Secondary | ICD-10-CM | POA: Diagnosis not present

## 2018-05-27 DIAGNOSIS — I4891 Unspecified atrial fibrillation: Secondary | ICD-10-CM | POA: Diagnosis not present

## 2018-05-27 DIAGNOSIS — Z6827 Body mass index (BMI) 27.0-27.9, adult: Secondary | ICD-10-CM | POA: Diagnosis not present

## 2018-05-27 DIAGNOSIS — R509 Fever, unspecified: Secondary | ICD-10-CM | POA: Diagnosis not present

## 2018-05-27 DIAGNOSIS — Z789 Other specified health status: Secondary | ICD-10-CM | POA: Diagnosis not present

## 2018-06-01 ENCOUNTER — Encounter: Payer: Self-pay | Admitting: *Deleted

## 2018-06-01 ENCOUNTER — Ambulatory Visit: Payer: PPO | Admitting: *Deleted

## 2018-06-01 DIAGNOSIS — I480 Paroxysmal atrial fibrillation: Secondary | ICD-10-CM

## 2018-06-01 DIAGNOSIS — Z5181 Encounter for therapeutic drug level monitoring: Secondary | ICD-10-CM

## 2018-06-09 ENCOUNTER — Encounter: Payer: Self-pay | Admitting: Neurology

## 2018-06-09 ENCOUNTER — Ambulatory Visit: Payer: PPO | Admitting: Neurology

## 2018-06-09 VITALS — BP 139/89 | HR 78 | Ht 70.0 in | Wt 192.0 lb

## 2018-06-09 DIAGNOSIS — Z7901 Long term (current) use of anticoagulants: Secondary | ICD-10-CM | POA: Insufficient documentation

## 2018-06-09 DIAGNOSIS — I63411 Cerebral infarction due to embolism of right middle cerebral artery: Secondary | ICD-10-CM

## 2018-06-09 DIAGNOSIS — I480 Paroxysmal atrial fibrillation: Secondary | ICD-10-CM | POA: Diagnosis not present

## 2018-06-09 DIAGNOSIS — R0683 Snoring: Secondary | ICD-10-CM

## 2018-06-09 NOTE — Progress Notes (Signed)
SLEEP MEDICINE CLINIC   Provider:  Larey Seat, MD   Primary Care Physician:  Glenda Chroman, MD   Referring Provider: Dr.  Leta Baptist  Chief Complaint  Patient presents with  . New Patient (Initial Visit)    pt alone, rm 11. pt presents today for sleep study. pt has never had a sleep study.pt denies having any difficulty with sleep. in july 2019 he had a stroke. when he wakes up from sleeping he feels refreshed. pt averages about 4-5 hrs wakes up 1-2 am gets up uses bathroom. usually can go back to sleep. he states that he notices that when he lays supine he snores but he places a pillow to prop on side and that helps.      HPI:  Douglas Lee is a 73 y.o. male , seen here on 06-09-2018 upon  referral  from Dr. Leta Baptist  for a sleep study. Patient had suffered a stroke in 2019, and had no pre-event symptoms, her was later identified to have atrial fibrillation, but he has never felt palpitations.   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 He has never had a sleep study and denies having any difficulty with sleep.  In July 2409 he had an embolic stroke, related to atrial fibrillation ( he was diagnosed 10 years earlier) , but was without palpitations or other symptoms. He is now on Eloquis.  He feels refreshed when he wakes up from sleeping. pt averages about 4-5 hrs wakes up 1-2 am and gets up,  uses bathroom and usually can go back to sleep. He states that he notices that when he lays supine he snores- but he places a pillow to prop on side and that helps.    Sleep habits are as follows: Supper time is anywhere between 4 and 6 PM, bedtime is usually before midnight but can vary greatly.  The bedroom is described as cool, quiet and dark.  He prefers to sleep on his left side with the positioning of a body pillow, supine sleep leads to more snoring and choking.  Uses one pillow for  head support only his bed is not adjusted flat mattress. He will have nocturia x1 around 4 AM 5 AM, usually can go back to sleep. He may sleep another 2 or 4 hours.  Altogether he would average between 8 and 10 hours of sleep.  He will occasionally take a daytime nap, but he normally does not have an irresistible urge to sleep. He may take a 30 minute power nap on weekends- always afternoon.   Sleep medical history : no sleep walking and no night terrors.   Family sleep history:  All his siblings have passed, brother was 74 year old and had cancer, had a liver transplant, and a pacemaker. Father had MI at 22, mother died at age 51.  Social history: Remarried -  [employer] member, non smoker, quit ETOH over 30 years ago. Caffeine - coffee 2 cups a day, diet pepsi in daytime.   Iced tea - rarely.    Dr Gladstone Lighter notes - /13/2019 patient was at [facility] function, had 6 tenderloin biscuit sandwiches, then had sudden onset of left-sided weakness.  He felt left leg weakness initially and then left arm weakness.  Patient fell down in some chairs.  Initially bystanders thought he was joking around but then realized he was having some problem.  They called paramedics who came to scene.  Patient's blood pressure was elevated 198/98.  He had left arm drift on exam.  Left-sided weakness symptoms resolved within about 15 minutes.  No slurred speech.  No facial weakness or numbness.  No vision loss.  No headaches. Patient was taken to [facility].  I do not have these records to review myself but according to patient he was evaluated for chest pain problems and falling down.  Patient was then discharged home. Patient went to PCP on Monday, and concern of possible TIA versus stroke was raised.  Therefore MRI of the brain was obtained which showed multiple embolic strokes in the right parietal lobe and insular region.  Therefore patient referred here for urgent consultation today. Since that  time patient is doing well.  No residual numbness or weakness symptoms.  Patient was previously on aspirin 325 mg/day. After MRI showed acute strokes he was switched to aspirin 81 mg plus Plavix 75 mg per day.  I reviewed primary care notes as above.  Also I reviewed cardiology notes which mention that patient was offered to start anticoagulation several times in the past for atrial fibrillation but he preferred to stay on Aspirin.    Review of Systems: Out of a complete 14 system review, the patient complains of only the following symptoms, and all other reviewed systems are negative.  snoring   Epworth score 0, Fatigue severity score 9 , depression score N/A   Social History   Socioeconomic History  . Marital status: Married    Spouse name: Not on file  . Number of children: Not on file  . Years of education: Not on file  . Highest education level: Not on file  Occupational History  . Not on file  Social Needs  . Financial resource strain: Not on file  . Food insecurity:    Worry: Not on file    Inability: Not on file  . Transportation needs:    Medical: Not on file    Non-medical: Not on file  Tobacco Use  . Smoking status: Never Smoker  . Smokeless tobacco: Never Used  Substance and Sexual Activity  . Alcohol use: No    Alcohol/week: 0.0 standard drinks  . Drug use: No  . Sexual activity: Not on file  Lifestyle  . Physical activity:    Days per week: Not on file    Minutes per session: Not on file  . Stress: Not on file  Relationships  . Social connections:    Talks on phone: Not on file    Gets together: Not on file    Attends religious service: Not on file    Active member of club or organization: Not on file    Attends meetings of clubs or organizations: Not on file    Relationship status: Not on file  . Intimate partner violence:    Fear of current or ex partner: Not on file    Emotionally abused: Not on file    Physically abused: Not on file    Forced  sexual activity: Not on file  Other Topics Concern  . Not on file  Social History Narrative  . Not on file    Family History  Problem Relation Age of Onset  . Heart attack Father 89  . Stroke Mother 64  . Hypertension Other   . Arrhythmia Brother        PPM    Past Medical History:  Diagnosis Date  .  Atrial fibrillation ([place])   . Essential hypertension   . Heart disease   . HLD (hyperlipidemia)   . Hyperlipemia     Past Surgical History:  Procedure Laterality Date  . KNEE ARTHROTOMY Right   . MINOR HEMORRHOIDECTOMY  07/12/2015    Current Outpatient Medications  Medication Sig Dispense Refill  . apixaban (ELIQUIS) 5 MG TABS tablet Take 1 tablet (5 mg total) by mouth 2 (two) times daily. 60 tablet 6  . Calcium Carbonate (CALCIUM 600 PO) Take 1 tablet by mouth daily.    . Cholecalciferol (VITAMIN D PO) Take 1 tablet by mouth daily.    . Coenzyme Q10 (CO Q-10 PO) Take 1 tablet by mouth daily.    Marland Kitchen L-LYSINE PO Take 1 tablet by mouth daily.    Marland Kitchen lisinopril (PRINIVIL,ZESTRIL) 10 MG tablet Take 10 mg by mouth daily.    Marland Kitchen lovastatin (MEVACOR) 40 MG tablet Take 40 mg by mouth at bedtime.    . metoprolol (LOPRESSOR) 100 MG tablet Take 100 mg by mouth 2 (two) times daily.    . Multiple Vitamin (MULTIVITAMIN) tablet Take 1 tablet by mouth daily.    Marland Kitchen VITAMIN E PO Take 1 tablet by mouth daily.     No current facility-administered medications for this visit.     Allergies as of 06/09/2018 - Review Complete 06/09/2018  Allergen Reaction Noted  . Lipitor [atorvastatin] Other (See Comments)   . Penicillins  04/06/2014  . Sulfur  04/06/2014    Vitals: BP 139/89   Pulse 78   Ht 5\' 10"  (1.778 m)   Wt 192 lb (87.1 kg)   BMI 27.55 kg/m  Last Weight:  Wt Readings from Last 1 Encounters:  06/09/18 192 lb (87.1 kg)   LGX:QJJH mass index is 27.55 kg/m.     Last Height:   Ht Readings from Last 1 Encounters:  06/09/18 5\' 10"  (1.778 m)    Physical exam:  General: The patient  is awake, alert and appears not in acute distress. The patient is well groomed. Head: Normocephalic, atraumatic. Neck is supple. Mallampati 1,  neck circumference:16.". Nasal airflow congested ,  Retrognathia is seen. Overbite - no braces, expanders.   Cardiovascular:  Regular rate and rhythm, without  murmurs or carotid bruit, and without distended neck veins. Respiratory: Lungs are clear to auscultation. Skin:  Without evidence of edema, or rash Trunk: BMI is 27.5. The patient's posture is erect.   Neurologic exam : The patient is awake and alert, oriented to place and time.   Memory subjective described as intact.  Attention span & concentration ability appears normal.  Speech is fluent,  without dysarthria, dysphonia or aphasia.  Mood and affect are appropriate.  Cranial nerves: Pupils are equal and briskly reactive to light.. Extraocular movements  in vertical and horizontal planes intact and without nystagmus. Visual fields by finger perimetry are intact.Hearing to finger rub intact. Facial sensation intact to fine touch. Facial motor strength is symmetric and tongue and uvula move midline. Shoulder shrug was symmetrical.   Motor exam:   Normal tone, muscle bulk and symmetric strength in all extremities. Equal grip strength .  Sensory:  Fine touch, pinprick and vibration were tested in all extremities. Proprioception tested in the upper extremities was normal. Coordination: Rapid alternating movements in the fingers/hands was normal. Finger-to-nose maneuver  normal without evidence of ataxia, dysmetria or tremor. Gait and station: Patient walks without assistive device and is able unassisted to climb up to the exam table. Strength  within normal limits. Stance is stable and normal. Turns with 3 Steps. Deep tendon reflexes: in the  upper and lower extremities are symmetric and intact.   Assessment:  After physical and neurologic examination, review of laboratory studies,  Personal review  of imaging studies, reports of other /same  Imaging studies, results of polysomnography and / or neurophysiology testing and pre-existing records as far as provided in visit., my assessment is   1) Due to Paroxysmal atrial fibrillation-  This patient will need an sleep study-  He prefers a HST.  The patient was advised of the nature of the diagnosed disorder , the treatment options and the  risks for general health and wellness arising from not treating the condition.   I spent more than 40 minutes of face to face time with the patient.  Greater than 50% of time was spent in counseling and coordination of care. We have discussed the diagnosis and differential and I answered the patient's questions.    Plan:  Treatment plan and additional workup :  HST , patient preferred.   Larey Seat, MD 06/06/5496, 26:41 AM  Certified in Neurology by ABPN Certified in [place] by [facility] [address] [place], [place] [address]

## 2018-06-09 NOTE — Patient Instructions (Signed)
Atrial Fibrillation Atrial fibrillation is a type of irregular or rapid heartbeat (arrhythmia). In atrial fibrillation, the top part of the heart (atria) quivers in a chaotic pattern. This makes the heart unable to pump blood normally. Having atrial fibrillation can increase your risk for other health problems, such as:  Blood can pool in the atria and form clots. If a clot travels to the brain, it can cause a stroke.  The heart muscle may weaken from the irregular blood flow. This can cause heart failure. Atrial fibrillation may start suddenly and stop on its own, or it may become a long-lasting problem. What are the causes? This condition is caused by some heart-related conditions or procedures, including:  High blood pressure. This is the most common cause.  Heart failure.  Heart valve conditions.  Inflammation of the sac that surrounds the heart (pericarditis).  Heart surgery.  Coronary artery disease.  Certain heart rhythm disorders, such as Wolf-Parkinson-White syndrome. Other causes include:  Pneumonia.  Obstructive sleep apnea.  Lung cancer.  Thyroid problems, especially if the thyroid is overactive (hyperthyroidism).  Excessive alcohol or drug use. Sometimes, the cause of this condition is not known. What increases the risk? This condition is more likely to develop in:  Older people.  People who smoke.  People who have diabetes mellitus.  People who are overweight (obese).  Athletes who exercise vigorously.  People who have a family history. What are the signs or symptoms? Symptoms of this condition include:  A feeling that your heart is beating rapidly or irregularly.  A feeling of discomfort or pain in your chest.  Shortness of breath.  Sudden light-headedness or weakness.  Getting tired easily during exercise. In some cases, there are no symptoms. How is this diagnosed? Your health care provider may be able to detect atrial fibrillation when  taking your pulse. If detected, this condition may be diagnosed with:  Electrocardiogram (ECG).  Ambulatory cardiac monitor. This device records your heartbeats for 24 hours or more.  Transthoracic echocardiogram (TTE) to evaluate how blood flows through your heart.  Transesophageal echocardiogram (TEE) to view more detailed images of your heart.  A stress test.  Imaging tests, such as a CT scan or chest X-ray.  Blood tests. How is this treated? This condition may be treated with:  Medicines to slow down the heart rate or bring the heart's rhythm back to normal.  Medicines to prevent blood clots from forming.  Electrical cardioversion. This delivers a low-energy shock to the heart to reset its rhythm.  Ablation. This procedure destroys the part of the heart tissue that sends abnormal signals.  Left atrial appendage occlusion/excision. This seals off a common place in the atria where blood clots can form (left atrial appendage). The goal of treatment is to prevent blood clots from forming and to keep your heart beating at a normal rate and rhythm. Treatment depends on underlying medical conditions and how you feel when you are experiencing fibrillation. Follow these instructions at home: Medicines  Take over-the counter and prescription medicines only as told by your health care provider.  If your health care provider prescribed a blood-thinning medicine (anticoagulant), take it exactly as told. Taking too much blood-thinning medicine can cause bleeding. Taking too little can enable a blood clot to form and travel to the brain, causing a stroke. Lifestyle      Do not use any products that contain nicotine or tobacco, such as cigarettes and e-cigarettes. If you need help quitting, ask your health   care provider.  Do not drink beverages that contain caffeine, such as coffee, soda, and tea.  Follow diet instructions as told by your health care provider.  Exercise regularly as  told by your health care provider.  Do not drink alcohol. General instructions  If you have obstructive sleep apnea, manage your condition as told by your health care provider.  Maintain a healthy weight. Do not use diet pills unless your health care provider approves. Diet pills may make heart problems worse.  Keep all follow-up visits as told by your health care provider. This is important. Contact a health care provider if you:  Notice a change in the rate, rhythm, or strength of your heartbeat.  Are taking an anticoagulant and you notice increased bruising.  Tire more easily when you exercise or exert yourself.  Have a sudden change in weight. Get help right away if you have:   Chest pain, abdominal pain, sweating, or weakness.  Difficulty breathing.  Blood in your vomit, stool (feces), or urine.  Any symptoms of a stroke. "BE FAST" is an easy way to remember the main warning signs of a stroke: ? B - Balance. Signs are dizziness, sudden trouble walking, or loss of balance. ? E - Eyes. Signs are trouble seeing or a sudden change in vision. ? F - Face. Signs are sudden weakness or numbness of the face, or the face or eyelid drooping on one side. ? A - Arms. Signs are weakness or numbness in an arm. This happens suddenly and usually on one side of the body. ? S - Speech. Signs are sudden trouble speaking, slurred speech, or trouble understanding what people say. ? T - Time. Time to call emergency services. Write down what time symptoms started.  Other signs of a stroke, such as: ? A sudden, severe headache with no known cause. ? Nausea or vomiting. ? Seizure. These symptoms may represent a serious problem that is an emergency. Do not wait to see if the symptoms will go away. Get medical help right away. Call your local emergency services (911 in the [place] Do not drive yourself to the hospital. Summary  Atrial fibrillation is a type of irregular or rapid heartbeat  (arrhythmia).  Symptoms include a feeling that your heart is beating fast or irregularly. In some cases, you may not have symptoms.  The condition is treated with medicines to slow down the heart rate or bring the heart's rhythm back to normal. You may also need blood-thinning medicines to prevent blood clots.  Get help right away if you have symptoms or signs of a stroke. This information is not intended to replace advice given to you by your health care provider. Make sure you discuss any questions you have with your health care provider. Document Released: 03/24/2005 Document Revised: 05/15/2017 Document Reviewed: 05/15/2017 Elsevier Interactive Patient Education  2019 Elsevier Inc.  

## 2018-06-15 NOTE — Progress Notes (Signed)
Pt was started on Eliquis 5mg  bid for atrial fibrillation on 10/23/17 by .    Reviewed patients medication list.  Pt is not currently on any combined P-gp and strong CYP3A4 inhibitors/inducers (ketoconazole, traconazole, ritonavir, carbamazepine, phenytoin, rifampin, St. John's wort).  Reviewed labs 05/2018 from [place]  SCr 1.15 , Weight 86kg, CrCl 69.59.  Dose is appropriate based on age, weight, and SCr.  Hgb and HCT: 15.0/43.9  Plts 154  A full discussion of the nature of anticoagulants has been carried out.  A benefit/risk analysis has been presented to the patient, so that they understand the justification for choosing anticoagulation with Eliquis at this time.  The need for compliance is stressed.  Pt is aware to take the medication twice daily.  Side effects of potential bleeding are discussed, including unusual colored urine or stools, coughing up blood or coffee ground emesis, nose bleeds or serious fall or head trauma.  Discussed signs and symptoms of stroke. The patient should avoid any OTC items containing aspirin or ibuprofen.  Avoid alcohol consumption.   Call if any signs of abnormal bleeding.  Discussed financial obligations and resolved any difficulty in obtaining medication.  Next lab test in 6 months.

## 2018-07-05 ENCOUNTER — Encounter: Payer: Self-pay | Admitting: *Deleted

## 2018-07-05 ENCOUNTER — Telehealth: Payer: Self-pay | Admitting: *Deleted

## 2018-07-05 NOTE — Telephone Encounter (Signed)
Chart reviewed with patient.  Patient reminded to have weight and vitals ready before the phone call begins.    The patient verbally consented for a telehealth phone visit with [facility] and understands that his/her insurance company will be billed for the encounter.  Most recent [place] labs already in chart.  States he had stroke in July 2019 - at [facility].  Requested now.  [facility] notes are in chart from August 2019.

## 2018-07-06 ENCOUNTER — Telehealth: Payer: Self-pay | Admitting: Cardiology

## 2018-07-06 ENCOUNTER — Encounter: Payer: Self-pay | Admitting: Cardiology

## 2018-07-06 NOTE — Progress Notes (Signed)
Virtual Visit via Telephone Note    Evaluation Performed:  Follow-up visit  This visit type was conducted due to national recommendations for restrictions regarding the COVID-19 Pandemic (e.g. social distancing).  This format is felt to be most appropriate for this patient at this time.  All issues noted in this document were discussed and addressed.  No physical exam was performed (except for noted visual exam findings with Video Visits).  The patient verbally consented to a telehealth phone visit today with [facility] and is aware that their insurance company will be billed for the encounter.  Date:  07/07/2018   ID:  Douglas Lee, DOB 09/05/1945, MRN [id number]  Patient Location:  [address] [place], [place] [address]  Provider Location:   [facility] [place], [place], [place] [address] Phone: [phone]; Fax: [phone]  PCP:  Glenda Chroman, MD  Cardiologist:  Satira Sark, MD   Chief Complaint:  Follow-up atrial fibrillation  History of Present Illness:    Douglas Lee is a 73 y.o. male who presents via audio conferencing for a telehealth visit today.  He was last seen in May 2019.  He does not report any palpitations or chest pain at this time, no unusual degree of shortness of breath.  Patient had an interval stroke in July 2019 managed at [facility].  Brain MRI showed multiple embolic infarcts in the right parietal lobe and insular region.  He is now on anticoagulation with Eliquis, CHADSVASC score of 4.  He has been on anticoagulation since August of last year, denies any obvious bleeding episodes, no changes in stool.  I did talk with him about arranging follow-up screening lab work.  He continues to follow at [facility] and also through the [place] clinic.  Prior CV studies:   The following studies were reviewed today:  Echocardiogram 08/09/2015: Study Conclusions  - Left ventricle: The  cavity size was normal. Wall thickness was increased in a pattern of mild LVH. Systolic function was normal. The estimated ejection fraction was in the range of 60% to 65%. Wall motion was normal; there were no regional wall motion abnormalities. - Aortic valve: Mildly calcified annulus. Trileaflet; mildly thickened leaflets. Valve area (VTI): 2.24 cm^2. Valve area (Vmax): 1.93 cm^2. Valve area (Vmean): 2.14 cm^2. - Mitral valve: Mildly calcified annulus. Normal thickness leaflets. - Left atrium: The atrium was severely dilated. - Right ventricle: The cavity size was mildly dilated. - Right atrium: The atrium was severely dilated. - Technically adequate study.  ECG 10/17/2017: I personally reviewed the tracing which shows rate controlled atrial fibrillation with decreased R wave progression.  Past Medical History:  Diagnosis Date  . Atrial fibrillation ([place])   . Essential hypertension   . History of stroke 86/7619   Embolic, right parietal lobe and insular region  . Hyperlipemia    Past Surgical History:  Procedure Laterality Date  . KNEE ARTHROTOMY Right   . MINOR HEMORRHOIDECTOMY  07/12/2015     Current Meds  Medication Sig  . apixaban (ELIQUIS) 5 MG TABS tablet Take 1 tablet (5 mg total) by mouth 2 (two) times daily.  . Calcium Carbonate (CALCIUM 600 PO) Take 1 tablet by mouth daily.  . Coenzyme Q10 (CO Q-10 PO) Take 1 tablet by mouth daily.  Marland Kitchen L-LYSINE PO Take 1 tablet by mouth daily.  Marland Kitchen lisinopril (PRINIVIL,ZESTRIL) 10 MG tablet Take 10 mg by mouth daily.  Marland Kitchen  lovastatin (MEVACOR) 40 MG tablet Take 40 mg by mouth at bedtime.  . metoprolol (LOPRESSOR) 100 MG tablet Take 100 mg by mouth 2 (two) times daily.  . Multiple Vitamin (MULTIVITAMIN) tablet Take 1 tablet by mouth daily.  . Omega-3 Fatty Acids (FISH OIL) 1000 MG CAPS Take by mouth 3 (three) times daily.     Allergies:   Lipitor [atorvastatin]; Penicillins; and Sulfur   Social History   Tobacco Use  .  Smoking status: Never Smoker  . Smokeless tobacco: Never Used  Substance Use Topics  . Alcohol use: No    Alcohol/week: 0.0 standard drinks  . Drug use: No     Family Hx: The patient's family history includes Arrhythmia in his brother; Heart attack (age of onset: 19) in his father; Hypertension in an other family member; Stroke (age of onset: 27) in his mother.  ROS:   Please see the history of present illness.    Some trouble with memory. All other systems reviewed and are negative.   Labs/Other Tests and Data Reviewed:    Recent Labs:  August 2019: BUN 19, creatinine 1.13, potassium 4.9, hemoglobin 14.7, platelets 322 February 2020: AST 21, ALT 26, magnesium 2.1  Wt Readings from Last 3 Encounters:  07/07/18 189 lb (85.7 kg)  06/09/18 192 lb (87.1 kg)  04/27/18 193 lb 9.6 oz (87.8 kg)     Objective:    Vital Signs:  BP 128/85   Pulse 80   Ht 5\' 10"  (1.778 m)   Wt 189 lb (85.7 kg)   BMI 27.12 kg/m    ASSESSMENT & PLAN:    1.  Permanent atrial fibrillation, asymptomatic in terms of palpitations.  He continues on Lopressor for heart rate control.  CHADSVASC score is 4 at this time and he is on Eliquis, tolerating without reported bleeding episodes.  Arranging follow-up CBC and BMET.  2.  Essential hypertension, recent blood pressure reviewed and adequately controlled.  Continue lisinopril.  COVID-19 Education: The signs and symptoms of COVID-19 were discussed with the patient and how to seek care for testing (follow up with PCP or arrange E-visit).  The importance of social distancing was discussed today.  Patient Risk:   After full review of this patient's clinical status, I feel that they are at least moderate risk at this time.  Time:   Today, I have spent 8 minutes with the patient with telehealth technology discussing medications, screening for side effects and bleeding problems, reviewing cardiac symptomatology.     Medication Adjustments/Labs and Tests  Ordered: Current medicines are reviewed at length with the patient today.  Concerns regarding medicines are outlined above.  Tests Ordered: Orders Placed This Encounter  Procedures  . Basic metabolic panel  . CBC   Medication Changes: No orders of the defined types were placed in this encounter.   Disposition:  Follow up 6 months  Signed, Rozann Lesches, MD  07/07/2018 8:57 AM    [place]

## 2018-07-06 NOTE — Telephone Encounter (Signed)
° ° ° °  COVID-19 Pre-Screening Questions: ° °• Do you currently have a fever? No °•  °• Have you recently travelled on a cruise, internationally, or to [place], [place], [place], [place], [place], or [place], [place] ([employer]) ? No °•  °• Have you been in contact with someone that is currently pending confirmation of Covid19 testing or has been confirmed to have the Covid19 virus? No °•  °• Are you currently experiencing fatigue or cough? No  ° ° °   ° ° ° ° ° °

## 2018-07-07 ENCOUNTER — Telehealth (INDEPENDENT_AMBULATORY_CARE_PROVIDER_SITE_OTHER): Payer: PPO | Admitting: Cardiology

## 2018-07-07 VITALS — BP 128/85 | HR 80 | Ht 70.0 in | Wt 189.0 lb

## 2018-07-07 DIAGNOSIS — I10 Essential (primary) hypertension: Secondary | ICD-10-CM

## 2018-07-07 DIAGNOSIS — I4821 Permanent atrial fibrillation: Secondary | ICD-10-CM | POA: Diagnosis not present

## 2018-07-07 DIAGNOSIS — Z7901 Long term (current) use of anticoagulants: Secondary | ICD-10-CM

## 2018-07-07 NOTE — Patient Instructions (Addendum)
Medication Instructions:  Continue all current medications.  Labwork:  CBC, BMET - orders mailed to patient  Please do labs at [facility] will only do labs for their own physicians, not outside practices anymore.   These labs can be done in the next few weeks, as they are not urgent.    Office will contact with results via phone or letter.    Testing/Procedures: none  Follow-Up: Your physician wants you to follow up in: 6 months.  You will receive a reminder letter in the mail one-two months in advance.  If you don't receive a letter, please call our office to schedule the follow up appointment   Any Other Special Instructions Will Be Listed Below (If Applicable).  If you need a refill on your cardiac medications before your next appointment, please call your pharmacy.

## 2018-07-14 ENCOUNTER — Telehealth: Payer: Self-pay | Admitting: *Deleted

## 2018-07-14 NOTE — Telephone Encounter (Signed)
Patient called requesting to speak with Edrick Oh. Please call [phone].

## 2018-07-19 ENCOUNTER — Ambulatory Visit (INDEPENDENT_AMBULATORY_CARE_PROVIDER_SITE_OTHER): Payer: PPO | Admitting: Neurology

## 2018-07-19 DIAGNOSIS — G4733 Obstructive sleep apnea (adult) (pediatric): Secondary | ICD-10-CM

## 2018-07-19 DIAGNOSIS — I63411 Cerebral infarction due to embolism of right middle cerebral artery: Secondary | ICD-10-CM

## 2018-07-19 DIAGNOSIS — R0683 Snoring: Secondary | ICD-10-CM

## 2018-07-19 DIAGNOSIS — I480 Paroxysmal atrial fibrillation: Secondary | ICD-10-CM

## 2018-07-19 DIAGNOSIS — Z7901 Long term (current) use of anticoagulants: Secondary | ICD-10-CM

## 2018-07-20 ENCOUNTER — Other Ambulatory Visit: Payer: Self-pay

## 2018-07-22 DIAGNOSIS — Z6827 Body mass index (BMI) 27.0-27.9, adult: Secondary | ICD-10-CM | POA: Diagnosis not present

## 2018-07-22 DIAGNOSIS — I4891 Unspecified atrial fibrillation: Secondary | ICD-10-CM | POA: Diagnosis not present

## 2018-07-22 DIAGNOSIS — E7800 Pure hypercholesterolemia, unspecified: Secondary | ICD-10-CM | POA: Diagnosis not present

## 2018-07-22 DIAGNOSIS — Z299 Encounter for prophylactic measures, unspecified: Secondary | ICD-10-CM | POA: Diagnosis not present

## 2018-07-22 DIAGNOSIS — I10 Essential (primary) hypertension: Secondary | ICD-10-CM | POA: Diagnosis not present

## 2018-08-02 NOTE — Procedures (Signed)
Patient Information     First Name: Hamlin Last Name: Gurnoor Ursua: [id number]  Birth Date: 09-10-1945 Age: 73 Gender: Male  Referring Physician: Dr.  Leta Baptist BMI: 27.5 (W=192 lb, H=5' 10'')  Neck Circ.:  16 '' Epworth:  0/24 FSS: 9/63  Sleep Study Information    Study Date: Aug 01, 2018 S/H/A Version: [id number] / 4.1.1531 / 53  History: GEAN LAROSE is a 73 y.o. male, seen here on 06-09-2018. He has never had a sleep study and denies having any difficulty with sleep.  In July 4650 he had an embolic stroke, related to atrial fibrillation (he was diagnosed 10 years earlier), but was again without palpitations or other symptoms. He is now on Eloquis. He feels refreshed when he wakes up from sleeping. Pt averages a TST ( total sleep time)  of about 4-5 h, wakes up once1-2 AM, he uses bathroom and usually can go back to sleep.  He states that he notices that he snores when sleeping supine- but he places a pillow to prop on side and that helps.                Summary & Diagnosis:    Mild to moderate sleep apnea at 14.8/h general AHI was much exacerbated during REM sleep at REM AHI of 38.0/h. The RDI is significantly higher than the AHI, indicating s loud snoring.  Given the patients history of embolic stroke under atrial fibrillation, this degree of REM sleep dependent sleep apnea will need to be treated with CPAP.   Recommendations:       Auto CPAP 5-16 cm water with 2 cm EPR and interface of patient's choice and comfort. Heated humidity to be provided.  Physician Name:  Larey Seat, MD   08-02-2018                Sleep Summary  Oxygen Saturation Statistics   Start Study Time: End Study Time: Total Recording Time:  10:16:45 PM      6:12:39 AM   7 h, 55 min  Total Sleep Time % REM of Sleep Time:  6 h, 45 min  25.1    Mean:  96                     Minimum: 86                 Maximum: 100  Mean of Desaturations Nadirs (%): 93    Oxygen Desaturation %:   4-9  10-20 >20 Total  Events Number Total    69  3 95.8 4.2  0 0.0  72 100.0  Oxygen Saturation: <90 <=88 <85 <80 <70  Duration (minutes): Sleep % 0.7 0.2  0.5 0.0  0.1 0.0 0.0 0.0 0.0 0.0     Respiratory Indices      Total Events REM NREM All Night  pRDI:  127  pAHI:  98 ODI:  72  pAHIc:  43  % CSR: 16.7 41.6 38.0 32.0 16.3 11.7 7.0 3.8 3.2 19.2 14.8 10.9 6.5       Pulse Rate Statistics during Sleep (BPM)      Mean: 66 Minimum: 39 Maximum: 104    Indices are calculated using technically valid sleep time of 6 h, 37 min. pRDI/ pAHI are calculated using oxygen desaturations ? 3% SpO2  Body Position Statistics  Position Supine Prone Right Left Non-Supine  Sleep (min) 405.4 0.0 0.0 0.0 0.0  Sleep % 100.0 0.0 0.0  0.0 0.0  pRDI 19.2 N/A N/A N/A N/A  pAHI 14.8 N/A N/A N/A N/A  ODI 10.9 N/A N/A N/A N/A     Snoring Statistics Snoring Level (dB) >40 >50 >60 >70 >80 >Threshold (45)  Sleep (min) 114.2 23.7 0.4 0.0 0.0 60.4  Sleep % 28.2 5.8 0.1 0.0 0.0 14.9    Mean: 42 dB Sleep Stages Chart                       pAHI=14.8                                   Mild              Moderate                    Severe                                                 5              15                    30

## 2018-08-02 NOTE — Addendum Note (Signed)
Addended by: Larey Seat on: 08/02/2018 05:37 PM   Modules accepted: Orders

## 2018-08-03 ENCOUNTER — Telehealth: Payer: Self-pay | Admitting: Neurology

## 2018-08-03 DIAGNOSIS — Z6827 Body mass index (BMI) 27.0-27.9, adult: Secondary | ICD-10-CM | POA: Diagnosis not present

## 2018-08-03 DIAGNOSIS — Z713 Dietary counseling and surveillance: Secondary | ICD-10-CM | POA: Diagnosis not present

## 2018-08-03 DIAGNOSIS — M25562 Pain in left knee: Secondary | ICD-10-CM | POA: Diagnosis not present

## 2018-08-03 DIAGNOSIS — M25569 Pain in unspecified knee: Secondary | ICD-10-CM | POA: Diagnosis not present

## 2018-08-03 DIAGNOSIS — Z299 Encounter for prophylactic measures, unspecified: Secondary | ICD-10-CM | POA: Diagnosis not present

## 2018-08-03 DIAGNOSIS — I10 Essential (primary) hypertension: Secondary | ICD-10-CM | POA: Diagnosis not present

## 2018-08-03 NOTE — Telephone Encounter (Signed)
-----   Message from Larey Seat, MD sent at 08/02/2018  5:37 PM EDT ----- Study loaded 08-01-2018.   Patient Information    First Name: Douglas Last Name: Lee Rebert: [id number] Birth Date: 1945-04-17 Age: 73 Gender: Male Referring Physician: Dr.  Leta Baptist BMI: 27.5 (W=192 lb, H=5' 10'') Neck Circ.:  16 '' Epworth:  0/24 FSS: 9/63 Sleep Study Information   Study Date: Aug 01, 2018 S/H/A Version: [id number] / 4.1.1531 / 76  Summary & Diagnosis:   Mild to moderate sleep apnea at 14.8/h general AHI was much exacerbated during REM sleep at REM AHI of 38.0/h. The RDI is significantly higher than the AHI, indicating s loud snoring.  Given the patients history of embolic stroke under atrial fibrillation, this degree of REM sleep dependent sleep apnea will need to be treated with CPAP.   Recommendations:      Auto CPAP 5-16 cm water with 2 cm EPR and interface of patient's choice and comfort. Heated humidity to be provided.  Physician Name:  Larey Seat, MD   08-02-2018

## 2018-08-03 NOTE — Telephone Encounter (Signed)
I called pt. I advised pt that Dr. Brett Fairy reviewed their sleep study results and found that pt has mild to moderate apnea . Dr. Brett Fairy recommends that pt have sleep apnea. I reviewed PAP compliance expectations with the pt. Pt is agreeable to starting a CPAP. I advised pt that an order will be sent to a DME, [facility], and [facility] will call the pt within about one week after they file with the pt's insurance. [facility] will show the pt how to use the machine, fit for masks, and troubleshoot the CPAP if needed. A follow up appt was made for insurance purposes with Debbora Presto on June 30,2020 at 7:30 am. Pt verbalized understanding to arrive 15 minutes early and bring their CPAP. A letter with all of this information in it will be mailed to the pt as a reminder. I verified with the pt that the address we have on file is correct. Pt verbalized understanding of results. Pt had no questions at this time but was encouraged to call back if questions arise. I have sent the order to [facility] and have received confirmation that they have received the order.

## 2018-08-10 NOTE — Telephone Encounter (Signed)
Pt called in and stated he needs his CPAP supplies and script sent to [facility]

## 2018-08-10 NOTE — Telephone Encounter (Signed)
Sent the script to [place] for the patient, I will inform [facility] that he switched.

## 2018-08-10 NOTE — Telephone Encounter (Signed)
Will resend the [employer], faxed and attentioned to [place].

## 2018-08-10 NOTE — Telephone Encounter (Signed)
[facility] called in and stated they need demo on pt please make attn to [place]

## 2018-08-13 ENCOUNTER — Encounter: Payer: Self-pay | Admitting: *Deleted

## 2018-08-16 ENCOUNTER — Telehealth: Payer: Self-pay | Admitting: Cardiology

## 2018-08-16 MED ORDER — LISINOPRIL 10 MG PO TABS: 10.0000 mg | ORAL_TABLET | Freq: Every day | ORAL | 1 refills | Status: DC

## 2018-08-16 NOTE — Telephone Encounter (Signed)
Medication sent to pharmacy  

## 2018-08-16 NOTE — Telephone Encounter (Signed)
°*  STAT* If patient is at the pharmacy, call can be transferred to refill team.   1. Which medications need to be refilled?lisinopril (PRINIVIL,ZESTRIL) 10 MG tablet   2. Which pharmacy/location (including street and city if local pharmacy) is medication to be sent to? [facility] [place]  3. Do they need a 30 day or 90 day supply?

## 2018-08-26 DIAGNOSIS — G4733 Obstructive sleep apnea (adult) (pediatric): Secondary | ICD-10-CM | POA: Diagnosis not present

## 2018-09-26 DIAGNOSIS — G4733 Obstructive sleep apnea (adult) (pediatric): Secondary | ICD-10-CM | POA: Diagnosis not present

## 2018-10-04 ENCOUNTER — Telehealth: Payer: Self-pay

## 2018-10-04 NOTE — Telephone Encounter (Signed)
Spoke with the patient and they have given verbal consent to file insurance and to do a mychart video visit. Mychart video link has been sent to the patient.   

## 2018-10-05 ENCOUNTER — Telehealth (INDEPENDENT_AMBULATORY_CARE_PROVIDER_SITE_OTHER): Payer: PPO | Admitting: Family Medicine

## 2018-10-05 ENCOUNTER — Other Ambulatory Visit: Payer: Self-pay

## 2018-10-05 DIAGNOSIS — Z9989 Dependence on other enabling machines and devices: Secondary | ICD-10-CM | POA: Diagnosis not present

## 2018-10-05 DIAGNOSIS — G4733 Obstructive sleep apnea (adult) (pediatric): Secondary | ICD-10-CM

## 2018-10-05 NOTE — Progress Notes (Signed)
PATIENT: Douglas Lee DOB: 04/07/1946  REASON FOR VISIT: follow up HISTORY FROM: patient  Virtual Visit via Telephone Note  I connected with [name] on 10/05/18 at  7:30 AM EDT by telephone and verified that I am speaking with the correct person using two identifiers.   I discussed the limitations, risks, security and privacy concerns of performing an evaluation and management service by telephone and the availability of in person appointments. I also discussed with the patient that there may be a patient responsible charge related to this service. The patient expressed understanding and agreed to proceed.   History of Present Illness:  10/05/18 Douglas Lee is a 73 y.o. male here today for follow up for OSA on CPAP.  He reports that he has been using his CPAP machine every night.  He is frustrated as his mask does not fit well.  He continues to note a significant leak when using his mask.  He has reached out to [place] who was very helpful, however, they were unable to test the fit due to the pandemic.  He has a mustache and feels that this is inhibiting a good seal.  He does report benefit from using the machine.  He feels that he is resting a little better.  AHI remains elevated and significant leak is noted on the following download report.   09/05/2018 - 10/04/2018  Compliance Report Usage 09/05/2018 - 10/04/2018 Usage days 29/30 days (97%) >= 4 hours 18 days (60%) < 4 hours 11 days (37%) Usage hours 131 hours 56 minutes Average usage (total days) 4 hours 24 minutes Average usage (days used) 4 hours 33 minutes Median usage (days used) 4 hours 53 minutes Total used hours (value since last reset - 10/04/2018) 142 hours AirSense 10 AutoSet Serial number [id number] Mode AutoSet Min Pressure 5 cmH2O Max Pressure 16 cmH2O EPR Fulltime EPR level 2 Response Standard Therapy Pressure - cmH2O Median: 6.3 95th percentile: 8.8 Maximum: 9.8 Leaks - L/min  Median: 28.4 95th percentile: 49.6 Maximum: 61.1 Events per hour AI: 30.3 HI: 0.4 AHI: 30.7 Apnea Index Central: 7.9 Obstructive: 1.8 Unknown: 20.5 RERA Index 0.6 Cheyne-Stokes respiration (average duration per night) 1 hours 7 minutes (21%)   Observations/Objective:  Generalized: Well developed, in no acute distress  Mentation: Alert oriented to time, place, history taking. Follows all commands speech and language fluent   Assessment and Plan:  73 y.o. year old male  has a past medical history of Atrial fibrillation ([place]), Essential hypertension, History of stroke (10/2017), and Hyperlipemia. here with    ICD-10-CM   1. OSA on CPAP  G47.33    Z99.89    Mr. Mosley is very receptive to using CPAP therapy.  Download data reveals that he is using it every night.  We have discussed the need for use greater than 4 hours each night.  I feel that most likely an ill fitting mask is contributing to his inability to leave the machine on overnight.  I have reached out to the sleep lab manager.  We will bring Mr. Martinezlopez and for a mask fitting.  We will ensure that mask is functional to aid in compliance.  We will schedule a 4 to 6-week follow-up following mask refitting.  Patient is very appreciative of this.  He verbalizes understanding and agreement with the plan.  No orders of the defined types were placed in this encounter.   No orders of the defined types were placed in this encounter.  Follow Up Instructions:  I discussed the assessment and treatment plan with the patient. The patient was provided an opportunity to ask questions and all were answered. The patient agreed with the plan and demonstrated an understanding of the instructions.   The patient was advised to call back or seek an in-person evaluation if the symptoms worsen or if the condition fails to improve as anticipated.  I provided 30 minutes of non-face-to-face time during this encounter.  Patient is located at his place  of residence during teleconference.  My chart visit not performed due to technological concerns.  Provider is located at her place of residence.  Maryelizabeth Kaufmann, CMA helped to facilitate visit.   Debbora Presto, NP

## 2018-10-06 ENCOUNTER — Telehealth: Payer: Self-pay

## 2018-10-06 NOTE — Telephone Encounter (Signed)
Patient came for a mask fitting. He is currently with [employer] and they are not doing mask fitting with cpap at this time. He was set up on auto cpap and is struggling with mask leaks. I tried different mask and the F&P simplus medium fits him great. I hooked him up to cpap of 10 to see if there were any leaks and it was at 0. I had him turn from side to side and still no leak. He was happy with this mask. I gave him sample to take home and try for a few days. He will call me next week to let me now how its working. I will let his DME company know if this works.

## 2018-10-07 DIAGNOSIS — L821 Other seborrheic keratosis: Secondary | ICD-10-CM | POA: Diagnosis not present

## 2018-10-07 DIAGNOSIS — D225 Melanocytic nevi of trunk: Secondary | ICD-10-CM | POA: Diagnosis not present

## 2018-10-07 DIAGNOSIS — B9689 Other specified bacterial agents as the cause of diseases classified elsewhere: Secondary | ICD-10-CM | POA: Diagnosis not present

## 2018-10-07 DIAGNOSIS — L02821 Furuncle of head [any part, except face]: Secondary | ICD-10-CM | POA: Diagnosis not present

## 2018-10-07 DIAGNOSIS — L718 Other rosacea: Secondary | ICD-10-CM | POA: Diagnosis not present

## 2018-10-12 DIAGNOSIS — M25569 Pain in unspecified knee: Secondary | ICD-10-CM | POA: Diagnosis not present

## 2018-10-12 DIAGNOSIS — I639 Cerebral infarction, unspecified: Secondary | ICD-10-CM | POA: Diagnosis not present

## 2018-10-12 DIAGNOSIS — I63411 Cerebral infarction due to embolism of right middle cerebral artery: Secondary | ICD-10-CM | POA: Diagnosis not present

## 2018-10-12 DIAGNOSIS — Z299 Encounter for prophylactic measures, unspecified: Secondary | ICD-10-CM | POA: Diagnosis not present

## 2018-10-12 DIAGNOSIS — I10 Essential (primary) hypertension: Secondary | ICD-10-CM | POA: Diagnosis not present

## 2018-10-12 DIAGNOSIS — Z6826 Body mass index (BMI) 26.0-26.9, adult: Secondary | ICD-10-CM | POA: Diagnosis not present

## 2018-10-19 DIAGNOSIS — I4891 Unspecified atrial fibrillation: Secondary | ICD-10-CM | POA: Diagnosis not present

## 2018-10-19 DIAGNOSIS — I10 Essential (primary) hypertension: Secondary | ICD-10-CM | POA: Diagnosis not present

## 2018-10-19 DIAGNOSIS — E7800 Pure hypercholesterolemia, unspecified: Secondary | ICD-10-CM | POA: Diagnosis not present

## 2018-10-19 DIAGNOSIS — Z299 Encounter for prophylactic measures, unspecified: Secondary | ICD-10-CM | POA: Diagnosis not present

## 2018-10-19 DIAGNOSIS — Z6827 Body mass index (BMI) 27.0-27.9, adult: Secondary | ICD-10-CM | POA: Diagnosis not present

## 2018-10-26 DIAGNOSIS — G4733 Obstructive sleep apnea (adult) (pediatric): Secondary | ICD-10-CM | POA: Diagnosis not present

## 2018-11-22 ENCOUNTER — Telehealth: Payer: Self-pay

## 2018-11-22 DIAGNOSIS — R0683 Snoring: Secondary | ICD-10-CM

## 2018-11-22 DIAGNOSIS — Z7901 Long term (current) use of anticoagulants: Secondary | ICD-10-CM

## 2018-11-22 DIAGNOSIS — G4733 Obstructive sleep apnea (adult) (pediatric): Secondary | ICD-10-CM

## 2018-11-22 DIAGNOSIS — I63411 Cerebral infarction due to embolism of right middle cerebral artery: Secondary | ICD-10-CM

## 2018-11-22 NOTE — Telephone Encounter (Signed)
Order placed for CPAP titration for the patient.

## 2018-11-22 NOTE — Telephone Encounter (Signed)
Patient called and was asking questions about his cpap. He feels he is not getting enough air and the machine displays he is still obstructing. I reviewed a download and he is having central apneas. HIs AHI is 23.0  On auto 5-16. His central apnea is 19.4. He needs anorder for cpap titration and changed to Bipap if centrals continue. Can you put the order in? I spoke to Dr. Brett Fairy and she agrees with plan.

## 2018-11-26 DIAGNOSIS — G4733 Obstructive sleep apnea (adult) (pediatric): Secondary | ICD-10-CM | POA: Diagnosis not present

## 2018-11-27 DIAGNOSIS — W57XXXA Bitten or stung by nonvenomous insect and other nonvenomous arthropods, initial encounter: Secondary | ICD-10-CM | POA: Diagnosis not present

## 2018-11-27 DIAGNOSIS — S30865A Insect bite (nonvenomous) of unspecified external genital organs, male, initial encounter: Secondary | ICD-10-CM | POA: Diagnosis not present

## 2018-11-30 ENCOUNTER — Ambulatory Visit (INDEPENDENT_AMBULATORY_CARE_PROVIDER_SITE_OTHER): Payer: PPO | Admitting: *Deleted

## 2018-11-30 ENCOUNTER — Other Ambulatory Visit: Payer: Self-pay

## 2018-11-30 ENCOUNTER — Other Ambulatory Visit (HOSPITAL_COMMUNITY)
Admission: RE | Admit: 2018-11-30 | Discharge: 2018-11-30 | Disposition: A | Discharge status: Home / Self Care | Payer: PPO | Source: Ambulatory Visit | Attending: Neurology | Admitting: Neurology

## 2018-11-30 DIAGNOSIS — Z01812 Encounter for preprocedural laboratory examination: Secondary | ICD-10-CM | POA: Diagnosis not present

## 2018-11-30 DIAGNOSIS — Z20828 Contact with and (suspected) exposure to other viral communicable diseases: Secondary | ICD-10-CM | POA: Diagnosis not present

## 2018-11-30 DIAGNOSIS — Z5181 Encounter for therapeutic drug level monitoring: Secondary | ICD-10-CM

## 2018-11-30 DIAGNOSIS — I4821 Permanent atrial fibrillation: Secondary | ICD-10-CM | POA: Diagnosis not present

## 2018-11-30 DIAGNOSIS — Z7901 Long term (current) use of anticoagulants: Secondary | ICD-10-CM | POA: Diagnosis not present

## 2018-11-30 LAB — SARS CORONAVIRUS 2 (TAT 6-24 HRS): SARS Coronavirus 2: NEGATIVE

## 2018-11-30 NOTE — Progress Notes (Signed)
Pt was started on Eliquis5mg  bidforatrial fibrillationon 10/23/17 byDr Domenic Polite.   Reviewed patients medication list. Pt is notcurrently on any combined P-gp and strong CYP3A4 inhibitors/inducers (ketoconazole, traconazole, ritonavir, carbamazepine, phenytoin, rifampin, St. John's wort). Reviewed labs 11/30/18 from [facility]. SCr 1.23  , [id number], CrCl63.94. Dose is appropriatebased on age, weight, and SCr. Hgb and HCT: 13.8/41.5  Plts 305  A full discussion of the nature of anticoagulants has been carried out. A benefit/risk analysis has been presented to the patient, so that they understand the justification for choosing anticoagulation with Eliquis at this time. The need for compliance is stressed. Pt is aware to take the medication twice daily. Side effects of potential bleeding are discussed, including unusual colored urine or stools, coughing up blood or coffee ground emesis, nose bleeds or serious fall or head trauma. Discussed signs and symptoms of stroke. The patient should avoid any OTC items containing aspirin or ibuprofen. Avoid alcohol consumption. Call if any signs of abnormal bleeding. Discussed financial obligations and resolved any difficulty in obtaining medication. Next lab test in 6 months.   Called pt with lab results and F/U appt 05/2019.  Copy of labs faxed to [facility].

## 2018-12-01 LAB — BASIC METABOLIC PANEL
BUN/Creatinine Ratio: 20 (calc) (ref 6–22)
BUN: 24 mg/dL (ref 7–25)
CO2: 25 mmol/L (ref 20–32)
Calcium: 9.9 mg/dL (ref 8.6–10.3)
Chloride: 105 mmol/L (ref 98–110)
Creat: 1.23 mg/dL — ABNORMAL HIGH (ref 0.70–1.18)
Glucose, Bld: 95 mg/dL (ref 65–139)
Potassium: 4.5 mmol/L (ref 3.5–5.3)
Sodium: 140 mmol/L (ref 135–146)

## 2018-12-01 LAB — CBC
HCT: 41.5 % (ref 38.5–50.0)
Hemoglobin: 13.8 g/dL (ref 13.2–17.1)
MCH: 32.2 pg (ref 27.0–33.0)
MCHC: 33.3 g/dL (ref 32.0–36.0)
MCV: 97 fL (ref 80.0–100.0)
MPV: 10.7 fL (ref 7.5–12.5)
Platelets: 305 10*3/uL (ref 140–400)
RBC: 4.28 10*6/uL (ref 4.20–5.80)
RDW: 12.2 % (ref 11.0–15.0)
WBC: 11.2 10*3/uL — ABNORMAL HIGH (ref 3.8–10.8)

## 2018-12-05 ENCOUNTER — Ambulatory Visit (INDEPENDENT_AMBULATORY_CARE_PROVIDER_SITE_OTHER): Payer: PPO | Admitting: Neurology

## 2018-12-05 DIAGNOSIS — G4731 Primary central sleep apnea: Secondary | ICD-10-CM | POA: Diagnosis not present

## 2018-12-05 DIAGNOSIS — R0683 Snoring: Secondary | ICD-10-CM

## 2018-12-05 DIAGNOSIS — Z7901 Long term (current) use of anticoagulants: Secondary | ICD-10-CM

## 2018-12-05 DIAGNOSIS — G4739 Other sleep apnea: Secondary | ICD-10-CM

## 2018-12-05 DIAGNOSIS — I63411 Cerebral infarction due to embolism of right middle cerebral artery: Secondary | ICD-10-CM

## 2018-12-05 DIAGNOSIS — G4733 Obstructive sleep apnea (adult) (pediatric): Secondary | ICD-10-CM

## 2018-12-06 ENCOUNTER — Telehealth: Payer: Self-pay

## 2018-12-06 ENCOUNTER — Other Ambulatory Visit: Payer: Self-pay | Admitting: Neurology

## 2018-12-06 ENCOUNTER — Telehealth: Payer: Self-pay | Admitting: Neurology

## 2018-12-06 DIAGNOSIS — G4733 Obstructive sleep apnea (adult) (pediatric): Secondary | ICD-10-CM

## 2018-12-06 DIAGNOSIS — I480 Paroxysmal atrial fibrillation: Secondary | ICD-10-CM

## 2018-12-06 DIAGNOSIS — G4739 Other sleep apnea: Secondary | ICD-10-CM | POA: Insufficient documentation

## 2018-12-06 DIAGNOSIS — Z7901 Long term (current) use of anticoagulants: Secondary | ICD-10-CM

## 2018-12-06 NOTE — Telephone Encounter (Signed)
I contacted the pt and we discussed sleep study results. Pt was agreeable to trying AVS machine and requested order be sent to [facility].  Order has been submitted.

## 2018-12-06 NOTE — Addendum Note (Signed)
Addended by: Larey Seat on: 12/06/2018 01:04 PM   Modules accepted: Orders

## 2018-12-06 NOTE — Telephone Encounter (Signed)
-----   Message from Larey Seat, MD sent at 12/06/2018  1:04 PM EDT ----- DIAGNOSIS  Treatment emergent central sleep apnea was first exacerbated by  CPAP, BiPAP and BiPAP ST and controlled finally under ASV.  1. Abnormal EKG with PACs, PVCs.    PLANS/RECOMMENDATIONS: ASV with settings of 15/5/0/3 cm water.  The patient used his own mask, a [employer] Simplus medium  sized FF-Mask.   DISCUSSION: Post-study, the patient indicated that sleep was  better than usual.  The patient used his own mask, a [employer] Simplus medium  sized FF-Mask.    RN please send note to PCP, Cc Dr Woody Seller,

## 2018-12-06 NOTE — Telephone Encounter (Signed)
Erika from [place] called needing the prescription explained to her. It's not quite clear. Mariann Laster will be in the office tomorrow.

## 2018-12-06 NOTE — Procedures (Signed)
PATIENT'S NAME:  Douglas, Lee DOB:      [birth year]      MR#:    [id number]     DATE OF RECORDING: 12/05/2018  AL REFERRING M.D.:  Douglas Spearman, MD Study Performed:   Titration to positive airway pressure HISTORY:  Douglas Lee, a 73 year old male patient, presents for attended PAP titration following auto CPAP- failure, suspicious for central apneas.   History of embolic Stroke, atrial fib, diastolic congestive heart failure. A HST on 07-19-2018 revealed AHI of 14.8/h with REM accentuation, followed by auto CPAP order (between 5 and 16 cm water). He developed an AHI of 23/h with central apneas emerging on CPAP.  The patient's weight 192 pounds with a height of 70 (inches), resulting in a BMI of 27.5 kg/m2. The patient's neck circumference measured 16 inches.  CURRENT MEDICATIONS: Eliquis, Lisinopril, Mevacor, Lopressor  PROCEDURE:  This is a multichannel digital polysomnogram utilizing the SomnoStar 11.2 system.  Electrodes and sensors were applied and monitored per AASM Specifications.   EEG, EOG, Chin and Limb EMG, were sampled at 200 Hz.  ECG, Snore and Nasal Pressure, Thermal Airflow, Respiratory Effort, CPAP Flow and Pressure, Oximetry was sampled at 50 Hz. Digital video and audio were recorded.      The patient presented immediately with central apneas at CPAP of 5 cm water pressure- AHI 7.5 /h. and therefore CPAP was not further used.  BiPAP was initiated at 9/5 cmH20 with heated humidity per AASM split night standards and pressure was advanced to 15 cmH20 but AHI increased to 27.2/h. changed to BiPAP ST with 12 breath per minute- AHI became 48/h. Titrated finally to ASV.  At a ASV pressure of 15/5/0/3 cmH20, there was a reduction of the AHI to 0.0 with and 02 nadir of 93% and a total sleep time of 251 minutes- a significant improvement of sleep apnea.  Lights Out was at 21:48 and Lights On at 04:59. Total recording time (TRT) was 431 minutes, with a total sleep time (TST) of 390.5  minutes. The patient's sleep latency was 10 minutes. REM latency was 72 minutes.  The sleep efficiency was 90.6 %.    SLEEP ARCHITECTURE: WASO (Wake after sleep onset) was 30.5 minutes.  There were 21.5 minutes in Stage N1, 84 minutes Stage N2, 194 minutes Stage N3 and 91 minutes in Stage REM.  The percentage of Stage N1 was 5.5%, Stage N2 was 21.5%, Stage N3 was 49.7% and Stage R (REM sleep) was 23.3%.   RESPIRATORY ANALYSIS:  There was a total of 54 respiratory events: 0 obstructive apneas, 54 central apneas and 0 mixed apneas with a total of 54 apneas and an apnea index (AI) of 8.3 /hour. There were 0 hypopneas with a hypopnea index of 0/hour.   The total APNEA/HYPOPNEA INDEX (AHI) was 8.3 /hour. 3 events occurred in REM sleep and 51 events in NREM. The REM AHI was 2. /hour versus a non-REM AHI of 10.2 /hour.  The patient spent 19 minutes of total sleep time in the supine position and 372 minutes in non-supine. The supine AHI was 0.0, versus a non-supine AHI of 8.7.  OXYGEN SATURATION & C02:  The baseline 02 saturation was 96%, with the lowest being 91%. Time spent below 89% saturation equaled 0 minutes. The arousals were noted as: 10 were spontaneous, 0 were associated with PLMs, and only one was associated with respiratory events. The patient had a total of 0 Periodic Limb Movements.   Audio and video  analysis did not show any abnormal or unusual movements, behaviors, phonations or vocalizations.   No nocturia. No Snoring was noted. EKG with PVCs.  DIAGNOSIS Treatment emergent central sleep apnea was first exacerbated by CPAP, BiPAP and BiPAP ST and controlled finally under ASV.  1. Abnormal EKG with PACs, PVCs.   PLANS/RECOMMENDATIONS: ASV with settings of 15/5/0/3 cm water. The patient used his own mask, a [employer] Simplus medium sized FF-Mask.   DISCUSSION: Post-study, the patient indicated that sleep was better than usual.  The patient used his own mask, a [employer] Simplus  medium sized FF-Mask.  A follow up appointment will be scheduled in the Sleep Clinic at [facility].   Please call [phone] with any questions.      I certify that I have reviewed the entire raw data recording prior to the issuance of this report in accordance with the Standards of Accreditation of the American Academy of Sleep Medicine (AASM).  Douglas Lee, M.D.   12-06-2018 Diplomat, American Board of Psychiatry and Neurology  Diplomat, [place] of Sleep Medicine Medical Director, [place] [facility]

## 2018-12-07 NOTE — Telephone Encounter (Signed)
I have updated the order and re-faxed to [facility].

## 2018-12-08 NOTE — Telephone Encounter (Signed)
I spoke with the pt today and he states he has been in touch with [place] and they are in need of the technical data/graphs from the the sleep study in order to process the ASV order. I have printed/faxed this information attention Mariann Laster at [facility] for the pt.

## 2018-12-08 NOTE — Telephone Encounter (Signed)
Pt has been scheduled for cpap compliance for f/u. Pt is asking for a call from [employer] to discuss an adapter. Pt reports he sent a e-mail with this information.

## 2018-12-09 NOTE — Telephone Encounter (Signed)
Spoke with patient and he is missing a piece that connects his mask to tubing. His email went to junk so I did not see it. I explained that to him and he understood. I will get him this piece to use for his mask.

## 2018-12-09 NOTE — Telephone Encounter (Signed)
Noted  

## 2018-12-16 DIAGNOSIS — I4891 Unspecified atrial fibrillation: Secondary | ICD-10-CM | POA: Diagnosis not present

## 2018-12-16 DIAGNOSIS — R21 Rash and other nonspecific skin eruption: Secondary | ICD-10-CM | POA: Diagnosis not present

## 2018-12-16 DIAGNOSIS — Z6827 Body mass index (BMI) 27.0-27.9, adult: Secondary | ICD-10-CM | POA: Diagnosis not present

## 2018-12-16 DIAGNOSIS — Z299 Encounter for prophylactic measures, unspecified: Secondary | ICD-10-CM | POA: Diagnosis not present

## 2018-12-16 DIAGNOSIS — I10 Essential (primary) hypertension: Secondary | ICD-10-CM | POA: Diagnosis not present

## 2018-12-16 DIAGNOSIS — I639 Cerebral infarction, unspecified: Secondary | ICD-10-CM | POA: Diagnosis not present

## 2018-12-20 DIAGNOSIS — G4731 Primary central sleep apnea: Secondary | ICD-10-CM | POA: Diagnosis not present

## 2019-01-19 DIAGNOSIS — G4731 Primary central sleep apnea: Secondary | ICD-10-CM | POA: Diagnosis not present

## 2019-01-20 DIAGNOSIS — Z6826 Body mass index (BMI) 26.0-26.9, adult: Secondary | ICD-10-CM | POA: Diagnosis not present

## 2019-01-20 DIAGNOSIS — I10 Essential (primary) hypertension: Secondary | ICD-10-CM | POA: Diagnosis not present

## 2019-01-20 DIAGNOSIS — E7800 Pure hypercholesterolemia, unspecified: Secondary | ICD-10-CM | POA: Diagnosis not present

## 2019-01-20 DIAGNOSIS — Z125 Encounter for screening for malignant neoplasm of prostate: Secondary | ICD-10-CM | POA: Diagnosis not present

## 2019-01-20 DIAGNOSIS — Z79899 Other long term (current) drug therapy: Secondary | ICD-10-CM | POA: Diagnosis not present

## 2019-01-20 DIAGNOSIS — I63411 Cerebral infarction due to embolism of right middle cerebral artery: Secondary | ICD-10-CM | POA: Diagnosis not present

## 2019-01-20 DIAGNOSIS — Z1339 Encounter for screening examination for other mental health and behavioral disorders: Secondary | ICD-10-CM | POA: Diagnosis not present

## 2019-01-20 DIAGNOSIS — I639 Cerebral infarction, unspecified: Secondary | ICD-10-CM | POA: Diagnosis not present

## 2019-01-20 DIAGNOSIS — Z0000 Encounter for general adult medical examination without abnormal findings: Secondary | ICD-10-CM | POA: Diagnosis not present

## 2019-01-20 DIAGNOSIS — Z7189 Other specified counseling: Secondary | ICD-10-CM | POA: Diagnosis not present

## 2019-01-20 DIAGNOSIS — Z299 Encounter for prophylactic measures, unspecified: Secondary | ICD-10-CM | POA: Diagnosis not present

## 2019-01-20 DIAGNOSIS — Z1211 Encounter for screening for malignant neoplasm of colon: Secondary | ICD-10-CM | POA: Diagnosis not present

## 2019-01-20 DIAGNOSIS — Z1331 Encounter for screening for depression: Secondary | ICD-10-CM | POA: Diagnosis not present

## 2019-02-06 ENCOUNTER — Other Ambulatory Visit: Payer: Self-pay | Admitting: Cardiology

## 2019-02-07 ENCOUNTER — Other Ambulatory Visit: Payer: Self-pay | Admitting: *Deleted

## 2019-02-07 MED ORDER — APIXABAN 5 MG PO TABS: 5.0000 mg | ORAL_TABLET | Freq: Two times a day (BID) | ORAL | 0 refills | Status: DC

## 2019-02-09 ENCOUNTER — Other Ambulatory Visit: Payer: Self-pay

## 2019-02-09 ENCOUNTER — Ambulatory Visit (INDEPENDENT_AMBULATORY_CARE_PROVIDER_SITE_OTHER): Payer: PPO | Admitting: Family Medicine

## 2019-02-09 ENCOUNTER — Encounter: Payer: Self-pay | Admitting: Family Medicine

## 2019-02-09 VITALS — BP 135/92 | HR 64 | Temp 98.0°F | Ht 70.0 in | Wt 191.0 lb

## 2019-02-09 DIAGNOSIS — Z7189 Other specified counseling: Secondary | ICD-10-CM

## 2019-02-09 DIAGNOSIS — G4731 Primary central sleep apnea: Secondary | ICD-10-CM

## 2019-02-09 NOTE — Patient Instructions (Signed)
Please continue ASV therapy nightly and for greater than 4 hours each night  Follow up in 6 months, sooner if needed  Sleep Apnea Sleep apnea affects breathing during sleep. It causes breathing to stop for a short time or to become shallow. It can also increase the risk of:  Heart attack.  Stroke.  Being very overweight (obese).  Diabetes.  Heart failure.  Irregular heartbeat. The goal of treatment is to help you breathe normally again. What are the causes? There are three kinds of sleep apnea:  Obstructive sleep apnea. This is caused by a blocked or collapsed airway.  Central sleep apnea. This happens when the brain does not send the right signals to the muscles that control breathing.  Mixed sleep apnea. This is a combination of obstructive and central sleep apnea. The most common cause of this condition is a collapsed or blocked airway. This can happen if:  Your throat muscles are too relaxed.  Your tongue and tonsils are too large.  You are overweight.  Your airway is too small. What increases the risk?  Being overweight.  Smoking.  Having a small airway.  Being older.  Being male.  Drinking alcohol.  Taking medicines to calm yourself (sedatives or tranquilizers).  Having family members with the condition. What are the signs or symptoms?  Trouble staying asleep.  Being sleepy or tired during the day.  Getting angry a lot.  Loud snoring.  Headaches in the morning.  Not being able to focus your mind (concentrate).  Forgetting things.  Less interest in sex.  Mood swings.  Personality changes.  Feelings of sadness (depression).  Waking up a lot during the night to pee (urinate).  Dry mouth.  Sore throat. How is this diagnosed?  Your medical history.  A physical exam.  A test that is done when you are sleeping (sleep study). The test is most often done in a sleep lab but may also be done at home. How is this treated?    Sleeping on your side.  Using a medicine to get rid of mucus in your nose (decongestant).  Avoiding the use of alcohol, medicines to help you relax, or certain pain medicines (narcotics).  Losing weight, if needed.  Changing your diet.  Not smoking.  Using a machine to open your airway while you sleep, such as: ? An oral appliance. This is a mouthpiece that shifts your lower jaw forward. ? A CPAP device. This device blows air through a mask when you breathe out (exhale). ? An EPAP device. This has valves that you put in each nostril. ? A BPAP device. This device blows air through a mask when you breathe in (inhale) and breathe out.  Having surgery if other treatments do not work. It is important to get treatment for sleep apnea. Without treatment, it can lead to:  High blood pressure.  Coronary artery disease.  In men, not being able to have an erection (impotence).  Reduced thinking ability. Follow these instructions at home: Lifestyle  Make changes that your doctor recommends.  Eat a healthy diet.  Lose weight if needed.  Avoid alcohol, medicines to help you relax, and some pain medicines.  Do not use any products that contain nicotine or tobacco, such as cigarettes, e-cigarettes, and chewing tobacco. If you need help quitting, ask your doctor. General instructions  Take over-the-counter and prescription medicines only as told by your doctor.  If you were given a machine to use while you sleep, use it  only as told by your doctor.  If you are having surgery, make sure to tell your doctor you have sleep apnea. You may need to bring your device with you.  Keep all follow-up visits as told by your doctor. This is important. Contact a doctor if:  The machine that you were given to use during sleep bothers you or does not seem to be working.  You do not get better.  You get worse. Get help right away if:  Your chest hurts.  You have trouble breathing in enough  air.  You have an uncomfortable feeling in your back, arms, or stomach.  You have trouble talking.  One side of your body feels weak.  A part of your face is hanging down. These symptoms may be an emergency. Do not wait to see if the symptoms will go away. Get medical help right away. Call your local emergency services (911 in the [place] Do not drive yourself to the hospital. Summary  This condition affects breathing during sleep.  The most common cause is a collapsed or blocked airway.  The goal of treatment is to help you breathe normally while you sleep. This information is not intended to replace advice given to you by your health care provider. Make sure you discuss any questions you have with your health care provider. Document Released: 01/01/2008 Document Revised: 01/08/2018 Document Reviewed: 11/17/2017 Elsevier Patient Education  2020 [employer].

## 2019-02-09 NOTE — Progress Notes (Signed)
PATIENT: Douglas Lee DOB: 09-07-1945  REASON FOR VISIT: follow up HISTORY FROM: patient  Chief Complaint  Patient presents with  . Follow-up    OSA follow up room 1 pt alone     HISTORY OF PRESENT ILLNESS: Today 02/09/19 Douglas Lee is a 73 y.o. male here today for follow up for central sleep apnea on ASV. He was previously treated with CPAP therapy but continued to have apneic events and felt that he was not getting enough air with CPAP. Compliance report reviewed and showed central apnea >19/hr. He was switched to ASV following titration study. He reports that he is feeing fantastic. He notes a significant improvement in sleep quality and increased energy levels. He denies any concerns with ASV.   Compliance report dated 01/08/2019 through 02/06/2019 reveals that he is using ASV nightly for compliance 100%.  He is using ASV greater than 4 hours every night for compliance of 100%.  Average usage was 7 hours and 54 minutes.  AHI is now 5.8 with EPAP of 5 cm of water, minimum pressure support of 3 cm of water and maximum pressure support of 15 cm of water.  There is no significant leak noted.  HISTORY: (copied from my note on 10/05/2018)  Douglas Lee is a 73 y.o. male here today for follow up for OSA on CPAP.  He reports that he has been using his CPAP machine every night.  He is frustrated as his mask does not fit well.  He continues to note a significant leak when using his mask.  He has reached out to [place] who was very helpful, however, they were unable to test the fit due to the pandemic.  He has a mustache and feels that this is inhibiting a good seal.  He does report benefit from using the machine.  He feels that he is resting a little better.  AHI remains elevated and significant leak is noted on the following download report.   09/05/2018 - 10/04/2018  Compliance Report Usage 09/05/2018 - 10/04/2018 Usage days 29/30 days (97%) >= 4 hours 18 days  (60%) < 4 hours 11 days (37%) Usage hours 131 hours 56 minutes Average usage (total days) 4 hours 24 minutes Average usage (days used) 4 hours 33 minutes Median usage (days used) 4 hours 53 minutes Total used hours (value since last reset - 10/04/2018) 142 hours AirSense 10 AutoSet Serial number [id number] Mode AutoSet Min Pressure 5 cmH2O Max Pressure 16 cmH2O EPR Fulltime EPR level 2 Response Standard Therapy Pressure - cmH2O Median: 6.3 95th percentile: 8.8 Maximum: 9.8 Leaks - L/min Median: 28.4 95th percentile: 49.6 Maximum: 61.1 Events per hour AI: 30.3 HI: 0.4 AHI: 30.7 Apnea Index Central: 7.9 Obstructive: 1.8 Unknown: 20.5 RERA Index 0.6 Cheyne-Stokes respiration (average duration per night) 1 hours 7 minutes (21%)   REVIEW OF SYSTEMS: Out of a complete 14 system review of symptoms, the patient complains only of the following symptoms, none and all other reviewed systems are negative.  Epworth sleepiness scale: 1 Fatigue severity scale: 14  ALLERGIES: Allergies  Allergen Reactions  . Lipitor [Atorvastatin] Other (See Comments)  . Penicillins   . Sulfur     HOME MEDICATIONS: Outpatient Medications Prior to Visit  Medication Sig Dispense Refill  . apixaban (ELIQUIS) 5 MG TABS tablet Take 1 tablet (5 mg total) by mouth 2 (two) times daily. 60 tablet 0  . Calcium Carbonate (CALCIUM 600 PO) Take 1 tablet by mouth daily.    Marland Kitchen  Coenzyme Q10 (CO Q-10 PO) Take 1 tablet by mouth daily.    Marland Kitchen L-LYSINE PO Take 1 tablet by mouth daily.    Marland Kitchen lisinopril (ZESTRIL) 10 MG tablet Take 1 tablet by mouth once daily 90 tablet 0  . lovastatin (MEVACOR) 40 MG tablet Take 40 mg by mouth at bedtime.    . metoprolol (LOPRESSOR) 100 MG tablet Take 100 mg by mouth 2 (two) times daily.    . Multiple Vitamin (MULTIVITAMIN) tablet Take 1 tablet by mouth daily.    . Omega-3 Fatty Acids (FISH OIL) 1000 MG CAPS Take by mouth 3 (three) times daily.    Marland Kitchen apixaban (ELIQUIS) 5 MG TABS tablet Take  by mouth.    Marland Kitchen lisinopril (ZESTRIL) 10 MG tablet Take by mouth.    . lovastatin (MEVACOR) 40 MG tablet Take by mouth.    . metoprolol tartrate (LOPRESSOR) 100 MG tablet Take by mouth.     No facility-administered medications prior to visit.     PAST MEDICAL HISTORY: Past Medical History:  Diagnosis Date  . Atrial fibrillation ([place])   . Essential hypertension   . History of stroke Q000111Q   Embolic, right parietal lobe and insular region  . Hyperlipemia     PAST SURGICAL HISTORY: Past Surgical History:  Procedure Laterality Date  . KNEE ARTHROTOMY Right   . MINOR HEMORRHOIDECTOMY  07/12/2015    FAMILY HISTORY: Family History  Problem Relation Age of Onset  . Heart attack Father 61  . Stroke Mother 66  . Hypertension Other   . Arrhythmia Brother        PPM    SOCIAL HISTORY: Social History   Socioeconomic History  . Marital status: Married    Spouse name: Not on file  . Number of children: Not on file  . Years of education: Not on file  . Highest education level: Not on file  Occupational History  . Not on file  Social Needs  . Financial resource strain: Not on file  . Food insecurity    Worry: Not on file    Inability: Not on file  . Transportation needs    Medical: Not on file    Non-medical: Not on file  Tobacco Use  . Smoking status: Never Smoker  . Smokeless tobacco: Never Used  Substance and Sexual Activity  . Alcohol use: No    Alcohol/week: 0.0 standard drinks  . Drug use: No  . Sexual activity: Not on file  Lifestyle  . Physical activity    Days per week: Not on file    Minutes per session: Not on file  . Stress: Not on file  Relationships  . Social Herbalist on phone: Not on file    Gets together: Not on file    Attends religious service: Not on file    Active member of club or organization: Not on file    Attends meetings of clubs or organizations: Not on file    Relationship status: Not on file  . Intimate partner violence     Fear of current or ex partner: Not on file    Emotionally abused: Not on file    Physically abused: Not on file    Forced sexual activity: Not on file  Other Topics Concern  . Not on file  Social History Narrative  . Not on file      PHYSICAL EXAM  Vitals:   02/09/19 1422  BP: (!) 135/92  Pulse: 64  Temp: 98 F (36.7 C)  Weight: 191 lb (86.6 kg)  Height: 5\' 10"  (1.778 m)   Body mass index is 27.41 kg/m.  Generalized: Well developed, in no acute distress  Cardiology: normal rate, irregularly irregular rhythm, no murmur noted Respiratory: Clear to auscultation bilaterally Neurological examination  Mentation: Alert oriented to time, place, history taking. Follows all commands speech and language fluent Cranial nerve II-XII: Pupils were equal round reactive to light. Extraocular movements were full, visual field were full on confrontational test.  Motor: The motor testing reveals 5 over 5 strength of all 4 extremities. Good symmetric motor tone is noted throughout.   Gait and station: Gait is normal.   DIAGNOSTIC DATA (LABS, IMAGING, TESTING) - I reviewed patient records, labs, notes, testing and imaging myself where available.  No flowsheet data found.   Lab Results  Component Value Date   WBC 11.2 (H) 11/30/2018   HGB 13.8 11/30/2018   HCT 41.5 11/30/2018   MCV 97.0 11/30/2018   PLT 305 11/30/2018      Component Value Date/Time   NA 140 11/30/2018 1441   K 4.5 11/30/2018 1441   CL 105 11/30/2018 1441   CO2 25 11/30/2018 1441   GLUCOSE 95 11/30/2018 1441   BUN 24 11/30/2018 1441   CREATININE 1.23 (H) 11/30/2018 1441   CALCIUM 9.9 11/30/2018 1441   No results found for: CHOL, HDL, LDLCALC, LDLDIRECT, TRIG, CHOLHDL No results found for: HGBA1C No results found for: VITAMINB12 No results found for: TSH     ASSESSMENT AND PLAN 73 y.o. year old male  has a past medical history of Atrial fibrillation ([place]), Essential hypertension, History of stroke  (10/2017), and Hyperlipemia. here with     ICD-10-CM   1. Complex sleep apnea syndrome  G47.31   2. Encounter for counseling on adaptive servo-ventilation (ASV) use  Z71.89     Mr Tonthat is doing great on ASV therapy.  Compliance data reveals excellent compliance.  AHI is now 5.8.  He is feeling significantly better.  He was encouraged to continue using ASV nightly and for greater than 4 hours each night.  He will call with any concerns that may arise.  He will follow-up with me in 6 months, sooner if needed.  He verbalizes understanding and agreement with this plan.   No orders of the defined types were placed in this encounter.    No orders of the defined types were placed in this encounter.     I spent 15 minutes with the patient. 50% of this time was spent counseling and educating patient on plan of care and medications.    Debbora Presto, FNP-C 02/09/2019, 2:34 PM [facility] [address] [place], [place] [address] [phone]

## 2019-02-19 DIAGNOSIS — G4731 Primary central sleep apnea: Secondary | ICD-10-CM | POA: Diagnosis not present

## 2019-02-24 ENCOUNTER — Encounter: Payer: Self-pay | Admitting: *Deleted

## 2019-02-24 ENCOUNTER — Telehealth (INDEPENDENT_AMBULATORY_CARE_PROVIDER_SITE_OTHER): Payer: PPO | Admitting: Family Medicine

## 2019-02-24 ENCOUNTER — Encounter: Payer: Self-pay | Admitting: Cardiology

## 2019-02-24 VITALS — Ht 70.0 in | Wt 185.0 lb

## 2019-02-24 DIAGNOSIS — I4821 Permanent atrial fibrillation: Secondary | ICD-10-CM

## 2019-02-24 DIAGNOSIS — I480 Paroxysmal atrial fibrillation: Secondary | ICD-10-CM

## 2019-02-24 DIAGNOSIS — Z79899 Other long term (current) drug therapy: Secondary | ICD-10-CM

## 2019-02-24 DIAGNOSIS — I10 Essential (primary) hypertension: Secondary | ICD-10-CM

## 2019-02-24 DIAGNOSIS — Z9989 Dependence on other enabling machines and devices: Secondary | ICD-10-CM | POA: Diagnosis not present

## 2019-02-24 DIAGNOSIS — I63411 Cerebral infarction due to embolism of right middle cerebral artery: Secondary | ICD-10-CM

## 2019-02-24 DIAGNOSIS — Z7901 Long term (current) use of anticoagulants: Secondary | ICD-10-CM

## 2019-02-24 DIAGNOSIS — Z8673 Personal history of transient ischemic attack (TIA), and cerebral infarction without residual deficits: Secondary | ICD-10-CM | POA: Diagnosis not present

## 2019-02-24 MED ORDER — APIXABAN 5 MG PO TABS: 5.0000 mg | ORAL_TABLET | Freq: Two times a day (BID) | ORAL | 2 refills | Status: DC

## 2019-02-24 MED ORDER — LISINOPRIL 10 MG PO TABS: 10.0000 mg | ORAL_TABLET | Freq: Every day | ORAL | 2 refills | Status: DC

## 2019-02-24 NOTE — Progress Notes (Signed)
Virtual Visit via Telephone Note   This visit type was conducted due to national recommendations for restrictions regarding the COVID-19 Pandemic (e.g. social distancing) in an effort to limit this patient's exposure and mitigate transmission in our community.  Due to his co-morbid illnesses, this patient is at least at moderate risk for complications without adequate follow up.  This format is felt to be most appropriate for this patient at this time.  The patient did not have access to video technology/had technical difficulties with video requiring transitioning to audio format only (telephone).  All issues noted in this document were discussed and addressed.  No physical exam could be performed with this format.  Please refer to the patient's chart for his  consent to telehealth for [facility].   Date:  02/24/2019   ID:  Douglas Lee, DOB 1945-05-08, MRN [id number]  Patient Location: Home Provider Location: Office  PCP:  Glenda Chroman, MD  Cardiologist:  Rozann Lesches, MD  Electrophysiologist:  None   Evaluation Performed:  Follow-Up Visit  Chief Complaint: Follow-up visit atrial fibrillation  History of Present Illness:    Douglas Lee is a 73 y.o. male with last visit virtual in April 2020.  At that visit patient denied any recent episodes of palpitations or chest pain.  He denied any dyspnea at that time.  Patient's heart rate was controlled at a rate of 80 at that time.  His blood pressure was 128/85 at previous telehealth visit.  Patient had experienced a CVA in July 2019.  An MRI showed multiple embolic infarcts in the right parietal lobe and insular region.  He was started on Eliquis.  CHA2DS2-VASc score of 4 at that time.  Patient denies any recent bleeding.  Patient states he has no residual focal neurological deficits.  Patient states he recently switched from a CPAP to BiPAP machine and is sleeping 7 to 9 hours per night and feels much better after starting this  new system.  He denies any symptoms of palpitations or arrhythmias.  His PCP is Dr. Woody Seller at [facility] internal medicine and goes to the [place] clinic.  The patient does not have symptoms concerning for COVID-19 infection (fever, chills, cough, or new shortness of breath).    Past Medical History:  Diagnosis Date  . Atrial fibrillation ([place])   . Essential hypertension   . History of stroke Q000111Q   Embolic, right parietal lobe and insular region  . Hyperlipemia    Past Surgical History:  Procedure Laterality Date  . KNEE ARTHROTOMY Right   . MINOR HEMORRHOIDECTOMY  07/12/2015     Current Meds  Medication Sig  . apixaban (ELIQUIS) 5 MG TABS tablet Take 1 tablet (5 mg total) by mouth 2 (two) times daily.  . Coenzyme Q10 (CO Q-10 PO) Take 1 tablet by mouth daily.  Marland Kitchen doxycycline (VIBRA-TABS) 100 MG tablet Take 100 mg by mouth 2 (two) times daily as needed.  . L-LYSINE PO Take 1 tablet by mouth daily.  Marland Kitchen lisinopril (ZESTRIL) 10 MG tablet Take 1 tablet (10 mg total) by mouth daily.  Marland Kitchen lovastatin (MEVACOR) 40 MG tablet Take 40 mg by mouth at bedtime.  . metoprolol (LOPRESSOR) 100 MG tablet Take 100 mg by mouth 2 (two) times daily.  . Multiple Vitamin (MULTIVITAMIN) tablet Take 1 tablet by mouth daily.  . Omega-3 Fatty Acids (FISH OIL) 1000 MG CAPS Take 1 capsule by mouth 3 (three) times daily.      Allergies:  Lipitor [atorvastatin], Penicillins, and Sulfur   Social History   Tobacco Use  . Smoking status: Never Smoker  . Smokeless tobacco: Never Used  Substance Use Topics  . Alcohol use: No    Alcohol/week: 0.0 standard drinks  . Drug use: No     Family Hx: The patient's family history includes Arrhythmia in his brother; Heart attack (age of onset: 100) in his father; Hypertension in an other family member; Stroke (age of onset: 75) in his mother.  ROS:   Please see the history of present illness.    All other systems reviewed and are negative.   Prior CV studies:   The  following studies were reviewed today: Echocardiogram in July 2019 showed   Labs/Other Tests and Data Reviewed:    EKG:  An ECG dated 10/17/2017 was personally reviewed today and demonstrated:   atrial fibrillation with a rate of 69.  Borderline left axis deviation.  Consider anterior infarct compared to a previous EKG 2017 in March October 17, 2017  Recent Labs: 11/30/2018: BUN 24; Creat 1.23; Hemoglobin 13.8; Platelets 305; Potassium 4.5; Sodium 140   Recent Lipid Panel No results found for: CHOL, TRIG, HDL, CHOLHDL, LDLCALC, LDLDIRECT  Wt Readings from Last 3 Encounters:  02/24/19 185 lb (83.9 kg)  02/09/19 191 lb (86.6 kg)  07/07/18 189 lb (85.7 kg)     Objective:    Vital Signs:  Ht 5\' 10"  (1.778 m)   Wt 185 lb (83.9 kg)   BMI 26.54 kg/m    Patient's speech pattern is with appropriate response to questions.  No evidence of shortness of breath, cough, or wheezing.  ASSESSMENT & PLAN:    1.  Permanent atrial fibrillation.  Patient denies any recent palpitations or arrhythmias.  Continues on Eliquis.  Last EKG showed atrial fibrillation with controlled rate of 69 in July 2019.  Patient may need another EKG in the future.  Continue metoprolol 100 mg p.o. twice daily   2.  Essential hypertension: Last recorded blood pressure on November 27, 2018 was 136/78.  Patient did not have a blood pressure recorded today.  Continue lisinopril 10 mg daily.   3.  CVA: Secondary to embolic event July [id number] now on Eliquis.  MRI showed multiple embolic infarcts in the right parietal lobe.  Continues on Eliquis with no evidence of bleeding.  Patient has residual focal neurologic deficits per his statement.  States it is primary presenting symptom when CVA occurred was arm paralysis.  This resolved prior to his discharge from hospital.  COVID-19 Education: The signs and symptoms of COVID-19 were discussed with the patient and how to seek care for testing (follow up with PCP or arrange E-visit). The  importance of social distancing was discussed today.  Time:   Today, I have spent  minutes with the patient with telehealth technology discussing the above problems.     Medication Adjustments/Labs and Tests Ordered: Current medicines are reviewed at length with the patient today.  Concerns regarding medicines are outlined above.   Tests Ordered: No orders of the defined types were placed in this encounter.   Medication Changes: Meds ordered this encounter  Medications  . lisinopril (ZESTRIL) 10 MG tablet    Sig: Take 1 tablet (10 mg total) by mouth daily.    Dispense:  90 tablet    Refill:  2  . apixaban (ELIQUIS) 5 MG TABS tablet    Sig: Take 1 tablet (5 mg total) by mouth 2 (two) times daily.  Dispense:  180 tablet    Refill:  2    Follow Up:  Either In Person or Virtual in 6 month(s)  Signed, Verta Ellen, NP  02/24/2019 1:11 PM    [place]

## 2019-02-24 NOTE — Patient Instructions (Addendum)

## 2019-02-28 ENCOUNTER — Telehealth: Payer: Self-pay | Admitting: *Deleted

## 2019-02-28 MED ORDER — ROSUVASTATIN CALCIUM 10 MG PO TABS: 10.0000 mg | ORAL_TABLET | Freq: Every day | ORAL | 3 refills | Status: DC

## 2019-02-28 NOTE — Telephone Encounter (Signed)
Patient informed and agrees to switching to crestor 10 mg daily and stopping lovastatin. Crestor rx sent to [facility].

## 2019-02-28 NOTE — Telephone Encounter (Signed)
Contacted patient-he says he was unable to talk at this time and requested a call back.

## 2019-02-28 NOTE — Telephone Encounter (Signed)
-----   Message from Satira Sark, MD sent at 02/28/2019  8:04 AM EST ----- Results reviewed.  Lab work received from PCP.  Eliquis dose is appropriate.  Recent LDL 120 on Mevacor.  With history of stroke, would favor more aggressive control.  He does have prior intolerance to Lipitor.  I wonder if he has ever tried Crestor.  If not, could consider starting Crestor 10 mg daily instead of Mevacor and hopefully get LDL closer to 70.

## 2019-03-16 DIAGNOSIS — Z1211 Encounter for screening for malignant neoplasm of colon: Secondary | ICD-10-CM | POA: Insufficient documentation

## 2019-04-18 DIAGNOSIS — H524 Presbyopia: Secondary | ICD-10-CM | POA: Diagnosis not present

## 2019-04-19 DIAGNOSIS — Z01818 Encounter for other preprocedural examination: Secondary | ICD-10-CM | POA: Diagnosis not present

## 2019-04-21 DIAGNOSIS — Z888 Allergy status to other drugs, medicaments and biological substances status: Secondary | ICD-10-CM | POA: Diagnosis not present

## 2019-04-21 DIAGNOSIS — E7800 Pure hypercholesterolemia, unspecified: Secondary | ICD-10-CM | POA: Diagnosis not present

## 2019-04-21 DIAGNOSIS — Z1211 Encounter for screening for malignant neoplasm of colon: Secondary | ICD-10-CM | POA: Diagnosis not present

## 2019-04-21 DIAGNOSIS — G4731 Primary central sleep apnea: Secondary | ICD-10-CM | POA: Diagnosis not present

## 2019-04-21 DIAGNOSIS — K5730 Diverticulosis of large intestine without perforation or abscess without bleeding: Secondary | ICD-10-CM | POA: Diagnosis not present

## 2019-04-21 DIAGNOSIS — I10 Essential (primary) hypertension: Secondary | ICD-10-CM | POA: Diagnosis not present

## 2019-04-21 DIAGNOSIS — I4891 Unspecified atrial fibrillation: Secondary | ICD-10-CM | POA: Diagnosis not present

## 2019-04-21 DIAGNOSIS — Z880 Allergy status to penicillin: Secondary | ICD-10-CM | POA: Diagnosis not present

## 2019-04-21 DIAGNOSIS — Z8673 Personal history of transient ischemic attack (TIA), and cerebral infarction without residual deficits: Secondary | ICD-10-CM | POA: Diagnosis not present

## 2019-04-21 DIAGNOSIS — G4730 Sleep apnea, unspecified: Secondary | ICD-10-CM | POA: Diagnosis not present

## 2019-04-21 DIAGNOSIS — Z882 Allergy status to sulfonamides status: Secondary | ICD-10-CM | POA: Diagnosis not present

## 2019-05-05 DIAGNOSIS — Z6828 Body mass index (BMI) 28.0-28.9, adult: Secondary | ICD-10-CM | POA: Diagnosis not present

## 2019-05-05 DIAGNOSIS — Z789 Other specified health status: Secondary | ICD-10-CM | POA: Diagnosis not present

## 2019-05-05 DIAGNOSIS — I4891 Unspecified atrial fibrillation: Secondary | ICD-10-CM | POA: Diagnosis not present

## 2019-05-05 DIAGNOSIS — I10 Essential (primary) hypertension: Secondary | ICD-10-CM | POA: Diagnosis not present

## 2019-05-05 DIAGNOSIS — H6120 Impacted cerumen, unspecified ear: Secondary | ICD-10-CM | POA: Diagnosis not present

## 2019-05-05 DIAGNOSIS — I63411 Cerebral infarction due to embolism of right middle cerebral artery: Secondary | ICD-10-CM | POA: Diagnosis not present

## 2019-05-05 DIAGNOSIS — Z299 Encounter for prophylactic measures, unspecified: Secondary | ICD-10-CM | POA: Diagnosis not present

## 2019-05-09 DIAGNOSIS — H6123 Impacted cerumen, bilateral: Secondary | ICD-10-CM | POA: Diagnosis not present

## 2019-05-09 DIAGNOSIS — H9113 Presbycusis, bilateral: Secondary | ICD-10-CM | POA: Insufficient documentation

## 2019-05-09 DIAGNOSIS — Z974 Presence of external hearing-aid: Secondary | ICD-10-CM | POA: Diagnosis not present

## 2019-05-10 ENCOUNTER — Other Ambulatory Visit: Payer: Self-pay | Admitting: *Deleted

## 2019-05-10 DIAGNOSIS — I10 Essential (primary) hypertension: Secondary | ICD-10-CM

## 2019-05-10 DIAGNOSIS — I480 Paroxysmal atrial fibrillation: Secondary | ICD-10-CM

## 2019-05-10 MED ORDER — LISINOPRIL 10 MG PO TABS: 10.0000 mg | ORAL_TABLET | Freq: Every day | ORAL | 2 refills | Status: DC

## 2019-05-22 DIAGNOSIS — G4731 Primary central sleep apnea: Secondary | ICD-10-CM | POA: Diagnosis not present

## 2019-06-17 ENCOUNTER — Ambulatory Visit
Admission: EM | Admit: 2019-06-17 | Discharge: 2019-06-17 | Disposition: A | Discharge status: Home / Self Care | Payer: PPO

## 2019-06-17 ENCOUNTER — Other Ambulatory Visit: Payer: Self-pay

## 2019-06-17 DIAGNOSIS — T162XXA Foreign body in left ear, initial encounter: Secondary | ICD-10-CM | POA: Diagnosis not present

## 2019-06-17 NOTE — Discharge Instructions (Addendum)
Follow-up with primary care Return for worsening of symptoms

## 2019-06-17 NOTE — ED Provider Notes (Signed)
[facility]    CSN: [id number] Arrival date & time: 06/17/19  0836      History   Chief Complaint Chief Complaint  Patient presents with  . Ear Fullness    HPI Douglas Lee is a 74 y.o. male.   Who presented to the urgent care with a  complaint of "part of hearing aid that lodged into his left ear last night".  He denies a precipitating event, such as swimming.  He states his hearing diminished since.  Has not tried any medication or tried to remove the foreign body.  Nothing make his symptoms worse.  Denies similar symptoms in the past  The history is provided by the patient. No language interpreter was used.  Ear Fullness    Past Medical History:  Diagnosis Date  . Atrial fibrillation ([place])   . Essential hypertension   . History of stroke Q000111Q   Embolic, right parietal lobe and insular region  . Hyperlipemia     Patient Active Problem List   Diagnosis Date Noted  . Treatment-emergent central sleep apnea 12/06/2018  . OSA on CPAP 10/05/2018  . Snoring 06/09/2018  . Paroxysmal atrial fibrillation ([place]) 06/09/2018  . Chronic anticoagulation 06/09/2018  . Primary osteoarthritis of right knee 04/06/2014  . Patellofemoral chondrosis of right knee 04/06/2014  . Loose body in knee 03/09/2014  . Hyperextension injury 03/09/2014  . Right knee pain 03/09/2014    Past Surgical History:  Procedure Laterality Date  . KNEE ARTHROTOMY Right   . MINOR HEMORRHOIDECTOMY  07/12/2015       Home Medications    Prior to Admission medications   Medication Sig Start Date End Date Taking? Authorizing Provider  apixaban (ELIQUIS) 5 MG TABS tablet Take 1 tablet (5 mg total) by mouth 2 (two) times daily. 02/24/19   Satira Sark, MD  Coenzyme Q10 (CO Q-10 PO) Take 1 tablet by mouth daily.    [provider]  doxycycline (VIBRA-TABS) 100 MG tablet Take 100 mg by mouth 2 (two) times daily as needed. 02/18/19   [provider]  L-LYSINE PO  Take 1 tablet by mouth daily.    [provider]  lisinopril (ZESTRIL) 10 MG tablet Take 1 tablet (10 mg total) by mouth daily. 05/10/19   Satira Sark, MD  metoprolol (LOPRESSOR) 100 MG tablet Take 100 mg by mouth 2 (two) times daily.    [provider]  Multiple Vitamin (MULTIVITAMIN) tablet Take 1 tablet by mouth daily.    [provider]  Omega-3 Fatty Acids (FISH OIL) 1000 MG CAPS Take 1 capsule by mouth 3 (three) times daily.     [provider]  rosuvastatin (CRESTOR) 10 MG tablet Take 1 tablet (10 mg total) by mouth daily. 02/28/19 05/29/19  Satira Sark, MD    Family History Family History  Problem Relation Age of Onset  . Heart attack Father 16  . Stroke Mother 9  . Hypertension Other   . Arrhythmia Brother        PPM    Social History Social History   Tobacco Use  . Smoking status: Never Smoker  . Smokeless tobacco: Never Used  Substance Use Topics  . Alcohol use: No    Alcohol/week: 0.0 standard drinks  . Drug use: No     Allergies   Lipitor [atorvastatin], Penicillins, and Sulfur   Review of Systems Review of Systems  Constitutional: Negative.   HENT: Positive for hearing loss.   Respiratory:  Negative.   Cardiovascular: Negative.   All other systems reviewed and are negative.    Physical Exam Triage Vital Signs ED Triage Vitals  Enc Vitals Group     BP 06/17/19 0849 (!) 142/90     Pulse Rate 06/17/19 0849 88     Resp 06/17/19 0849 20     Temp 06/17/19 0849 98 F (36.7 C)     Temp src --      SpO2 06/17/19 0849 96 %     Weight --      Height --      Head Circumference --      Peak Flow --      Pain Score 06/17/19 0847 0     Pain Loc --      Pain Edu? --      Excl. in [place]? --    No data found.  Updated Vital Signs BP (!) 142/90   Pulse 88   Temp 98 F (36.7 C)   Resp 20   SpO2 96%   Visual Acuity Right Eye Distance:   Left Eye Distance:   Bilateral Distance:    Right Eye Near:     Left Eye Near:    Bilateral Near:     Physical Exam Vitals and nursing note reviewed.  Constitutional:      General: He is not in acute distress.    Appearance: Normal appearance. He is normal weight. He is not ill-appearing or toxic-appearing.  HENT:     Head: Normocephalic.     Right Ear: Tympanic membrane, ear canal and external ear normal. There is no impacted cerumen. No foreign body.     Left Ear: External ear normal. There is no impacted cerumen. A foreign body is present.     Ears:     Comments: Post foreign body removal: Tympanic membrane within normal limit Cardiovascular:     Rate and Rhythm: Normal rate and regular rhythm.     Pulses: Normal pulses.     Heart sounds: Normal heart sounds. No murmur. No gallop.   Pulmonary:     Effort: Pulmonary effort is normal. No respiratory distress.     Breath sounds: Normal breath sounds. No stridor. No wheezing, rhonchi or rales.  Chest:     Chest wall: No tenderness.  Neurological:     Mental Status: He is alert.      UC Treatments / Results  Labs (all labs ordered are listed, but only abnormal results are displayed) Labs Reviewed - No data to display  EKG   Radiology No results found.  Procedures Procedures (ear foreign body removal 9 10 AM)  PROCEDURE:  Consent granted.  Left ear foreign body removal was performed by RN.  TM visualized.  PT tolerated procedure well.    Medications Ordered in UC Medications - No data to display  Initial Impression / Assessment and Plan / UC Course  I have reviewed the triage vital signs and the nursing notes.  Pertinent labs & imaging results that were available during my care of the patient were reviewed by me and considered in my medical decision making (see chart for details).     Patient is stable for discharge. Normal hearing return after foreign body removal Advised patient to follow-up with primary care To return for worsening symptoms   Final Clinical  Impressions(s) / UC Diagnoses   Final diagnoses:  Foreign body of left ear, initial encounter     Discharge Instructions  Follow-up with primary care Return for worsening of symptoms    ED Prescriptions    None     PDMP not reviewed this encounter.   Emerson Monte, Vivian 06/17/19 [phone]

## 2019-06-17 NOTE — ED Notes (Signed)
Small black  silicone tip removed with alligator forceps

## 2019-06-17 NOTE — ED Triage Notes (Signed)
Pt presents with piece of hearing aide in left ear that became lodged last night

## 2019-06-19 ENCOUNTER — Telehealth: Payer: Self-pay | Admitting: *Deleted

## 2019-06-19 DIAGNOSIS — G4731 Primary central sleep apnea: Secondary | ICD-10-CM | POA: Diagnosis not present

## 2019-06-19 NOTE — Telephone Encounter (Signed)
Returned pt's phone call.  Pt reports getting 2nd Covid vaccine yesterday.  States started last night with chills.  Today has fever 101.3 and general malaise.  Following CDC guidelines, informed pt his sxs are normal following Covid vaccination, and that his sxs should improve within a few days; he may take acetaminophen to help with fever and discomfort, and needs to drink plenty of fluids.  Informed pt if he has or develops any sxs concerning to him, he should f/u with his PCP.  Pt verbalized understanding.

## 2019-07-20 DIAGNOSIS — G4731 Primary central sleep apnea: Secondary | ICD-10-CM | POA: Diagnosis not present

## 2019-08-02 DIAGNOSIS — E7800 Pure hypercholesterolemia, unspecified: Secondary | ICD-10-CM | POA: Diagnosis not present

## 2019-08-02 DIAGNOSIS — Z299 Encounter for prophylactic measures, unspecified: Secondary | ICD-10-CM | POA: Diagnosis not present

## 2019-08-02 DIAGNOSIS — I10 Essential (primary) hypertension: Secondary | ICD-10-CM | POA: Diagnosis not present

## 2019-08-02 DIAGNOSIS — I69398 Other sequelae of cerebral infarction: Secondary | ICD-10-CM | POA: Diagnosis not present

## 2019-08-02 DIAGNOSIS — I4891 Unspecified atrial fibrillation: Secondary | ICD-10-CM | POA: Diagnosis not present

## 2019-08-09 NOTE — Patient Instructions (Signed)
Continue ASV nightly and greater than 4 hours each night.   Follow up in 1 year  Sleep Apnea Sleep apnea affects breathing during sleep. It causes breathing to stop for a short time or to become shallow. It can also increase the risk of:  Heart attack.  Stroke.  Being very overweight (obese).  Diabetes.  Heart failure.  Irregular heartbeat. The goal of treatment is to help you breathe normally again. What are the causes? There are three kinds of sleep apnea:  Obstructive sleep apnea. This is caused by a blocked or collapsed airway.  Central sleep apnea. This happens when the brain does not send the right signals to the muscles that control breathing.  Mixed sleep apnea. This is a combination of obstructive and central sleep apnea. The most common cause of this condition is a collapsed or blocked airway. This can happen if:  Your throat muscles are too relaxed.  Your tongue and tonsils are too large.  You are overweight.  Your airway is too small. What increases the risk?  Being overweight.  Smoking.  Having a small airway.  Being older.  Being male.  Drinking alcohol.  Taking medicines to calm yourself (sedatives or tranquilizers).  Having family members with the condition. What are the signs or symptoms?  Trouble staying asleep.  Being sleepy or tired during the day.  Getting angry a lot.  Loud snoring.  Headaches in the morning.  Not being able to focus your mind (concentrate).  Forgetting things.  Less interest in sex.  Mood swings.  Personality changes.  Feelings of sadness (depression).  Waking up a lot during the night to pee (urinate).  Dry mouth.  Sore throat. How is this diagnosed?  Your medical history.  A physical exam.  A test that is done when you are sleeping (sleep study). The test is most often done in a sleep lab but may also be done at home. How is this treated?   Sleeping on your side.  Using a medicine  to get rid of mucus in your nose (decongestant).  Avoiding the use of alcohol, medicines to help you relax, or certain pain medicines (narcotics).  Losing weight, if needed.  Changing your diet.  Not smoking.  Using a machine to open your airway while you sleep, such as: ? An oral appliance. This is a mouthpiece that shifts your lower jaw forward. ? A CPAP device. This device blows air through a mask when you breathe out (exhale). ? An EPAP device. This has valves that you put in each nostril. ? A BPAP device. This device blows air through a mask when you breathe in (inhale) and breathe out.  Having surgery if other treatments do not work. It is important to get treatment for sleep apnea. Without treatment, it can lead to:  High blood pressure.  Coronary artery disease.  In men, not being able to have an erection (impotence).  Reduced thinking ability. Follow these instructions at home: Lifestyle  Make changes that your doctor recommends.  Eat a healthy diet.  Lose weight if needed.  Avoid alcohol, medicines to help you relax, and some pain medicines.  Do not use any products that contain nicotine or tobacco, such as cigarettes, e-cigarettes, and chewing tobacco. If you need help quitting, ask your doctor. General instructions  Take over-the-counter and prescription medicines only as told by your doctor.  If you were given a machine to use while you sleep, use it only as told by your  doctor.  If you are having surgery, make sure to tell your doctor you have sleep apnea. You may need to bring your device with you.  Keep all follow-up visits as told by your doctor. This is important. Contact a doctor if:  The machine that you were given to use during sleep bothers you or does not seem to be working.  You do not get better.  You get worse. Get help right away if:  Your chest hurts.  You have trouble breathing in enough air.  You have an uncomfortable feeling  in your back, arms, or stomach.  You have trouble talking.  One side of your body feels weak.  A part of your face is hanging down. These symptoms may be an emergency. Do not wait to see if the symptoms will go away. Get medical help right away. Call your local emergency services (911 in the [place] Do not drive yourself to the hospital. Summary  This condition affects breathing during sleep.  The most common cause is a collapsed or blocked airway.  The goal of treatment is to help you breathe normally while you sleep. This information is not intended to replace advice given to you by your health care provider. Make sure you discuss any questions you have with your health care provider. Document Revised: 01/08/2018 Document Reviewed: 11/17/2017 Elsevier Patient Education  [place].

## 2019-08-09 NOTE — Progress Notes (Signed)
PATIENT: Douglas Lee DOB: 05/16/1945  REASON FOR VISIT: follow up HISTORY FROM: patient  Chief Complaint  Patient presents with  . Follow-up    back hall new room     HISTORY OF PRESENT ILLNESS: Today 08/10/19 Douglas Lee is a 74 y.o. male here today for follow up of central sleep apnea on ASV therapy.  He continues to note significant improvements in how he feels.  He wakes in the morning full of energy.  He is very happy using ASV therapy.  He denies any concerns with his machine or supplies.  Compliance report dated 07/10/2019 through 08/08/2019 reveals that he used ASV therapy 30 of the past 30 days for compliance of 100%.  He used ASV greater than 4 hours all 30 days for compliance of 100%.  Average usage was 7 hours and 42 minutes.  Residual AHI is 3.5 with a EPAP of 5 cm of water minimum pressure support of 3 cm of water and maximum pressure support of 15 cm of water.  There was no leak noted.   HISTORY: (copied from my note on 02/09/2019)  Douglas Lee is a 74 y.o. male here today for follow up for central sleep apnea on ASV. He was previously treated with CPAP therapy but continued to have apneic events and felt that he was not getting enough air with CPAP. Compliance report reviewed and showed central apnea >19/hr. He was switched to ASV following titration study. He reports that he is feeing fantastic. He notes a significant improvement in sleep quality and increased energy levels. He denies any concerns with ASV.   Compliance report dated 01/08/2019 through 02/06/2019 reveals that he is using ASV nightly for compliance 100%.  He is using ASV greater than 4 hours every night for compliance of 100%.  Average usage was 7 hours and 54 minutes.  AHI is now 5.8 with EPAP of 5 cm of water, minimum pressure support of 3 cm of water and maximum pressure support of 15 cm of water.  There is no significant leak noted.  HISTORY: (copied from my note on 10/05/2018)  Douglas T  Turneris a 73 y.o.malehere today for follow up for OSA on CPAP.He reports that he has been using his CPAP machine every night. He is frustrated as his mask does not fit well. He continues to note a significant leak when using his mask. He has reached out to [place] who was very helpful, however, they were unable to test the fit due to the pandemic. He has a mustache and feels that this is inhibiting a good seal. He does report benefit from using the machine. He feels that he is resting a little better. AHI remains elevated and significant leak is noted on the following download report.   09/05/2018 - 10/04/2018  Compliance Report Usage 09/05/2018 - 10/04/2018 Usage days 29/30 days (97%) >= 4 hours 18 days (60%) < 4 hours 11 days (37%) Usage hours 131 hours 56 minutes Average usage (total days) 4 hours 24 minutes Average usage (days used) 4 hours 33 minutes Median usage (days used) 4 hours 53 minutes Total used hours (value since last reset - 10/04/2018) 142 hours AirSense 10 AutoSet Serial number [id number] Mode AutoSet Min Pressure 5 cmH2O Max Pressure 16 cmH2O EPR Fulltime EPR level 2 Response Standard Therapy Pressure - cmH2O Median: 6.3 95th percentile: 8.8 Maximum: 9.8 Leaks - L/min Median: 28.4 95th percentile: 49.6 Maximum: 61.1 Events per hour AI: 30.3 HI: 0.4  AHI: 30.7 Apnea Index Central: 7.9 Obstructive: 1.8 Unknown: 20.5 RERA Index 0.6 Cheyne-Stokes respiration (average duration per night) 1 hours 7 minutes (21%)   REVIEW OF SYSTEMS: Out of a complete 14 system review of symptoms, the patient complains only of the following symptoms, none and all other reviewed systems are negative.   ALLERGIES: Allergies  Allergen Reactions  . Lipitor [Atorvastatin] Other (See Comments)  . Penicillins   . Sulfur     HOME MEDICATIONS: Outpatient Medications Prior to Visit  Medication Sig Dispense Refill  . apixaban (ELIQUIS) 5 MG TABS tablet Take 1  tablet (5 mg total) by mouth 2 (two) times daily. 180 tablet 2  . Coenzyme Q10 (CO Q-10 PO) Take 1 tablet by mouth daily.    Marland Kitchen L-LYSINE PO Take 1 tablet by mouth daily.    Marland Kitchen lisinopril (ZESTRIL) 10 MG tablet Take 1 tablet (10 mg total) by mouth daily. 90 tablet 2  . metoprolol (LOPRESSOR) 100 MG tablet Take 100 mg by mouth 2 (two) times daily.    . Multiple Vitamin (MULTIVITAMIN) tablet Take 1 tablet by mouth daily.    . Omega-3 Fatty Acids (FISH OIL) 1000 MG CAPS Take 1 capsule by mouth 3 (three) times daily.     . rosuvastatin (CRESTOR) 10 MG tablet Take 1 tablet (10 mg total) by mouth daily. 90 tablet 3  . doxycycline (VIBRA-TABS) 100 MG tablet Take 100 mg by mouth 2 (two) times daily as needed.     No facility-administered medications prior to visit.    PAST MEDICAL HISTORY: Past Medical History:  Diagnosis Date  . Atrial fibrillation ([place])   . Essential hypertension   . History of stroke Q000111Q   Embolic, right parietal lobe and insular region  . Hyperlipemia     PAST SURGICAL HISTORY: Past Surgical History:  Procedure Laterality Date  . KNEE ARTHROTOMY Right   . MINOR HEMORRHOIDECTOMY  07/12/2015    FAMILY HISTORY: Family History  Problem Relation Age of Onset  . Heart attack Father 34  . Stroke Mother 19  . Hypertension Other   . Arrhythmia Brother        PPM    SOCIAL HISTORY: Social History   Socioeconomic History  . Marital status: Married    Spouse name: Not on file  . Number of children: Not on file  . Years of education: Not on file  . Highest education level: Not on file  Occupational History  . Not on file  Tobacco Use  . Smoking status: Never Smoker  . Smokeless tobacco: Never Used  Substance and Sexual Activity  . Alcohol use: No    Alcohol/week: 0.0 standard drinks  . Drug use: No  . Sexual activity: Not on file  Other Topics Concern  . Not on file  Social History Narrative  . Not on file   Social Determinants of Health   Financial  Resource Strain:   . Difficulty of Paying Living Expenses:   Food Insecurity:   . Worried About Charity fundraiser in the Last Year:   . Arboriculturist in the Last Year:   Transportation Needs:   . Film/video editor (Medical):   Marland Kitchen Lack of Transportation (Non-Medical):   Physical Activity:   . Days of Exercise per Week:   . Minutes of Exercise per Session:   Stress:   . Feeling of Stress :   Social Connections:   . Frequency of Communication with Friends and Family:   .  Frequency of Social Gatherings with Friends and Family:   . Attends Religious Services:   . Active Member of Clubs or Organizations:   . Attends Archivist Meetings:   Marland Kitchen Marital Status:   Intimate Partner Violence:   . Fear of Current or Ex-Partner:   . Emotionally Abused:   Marland Kitchen Physically Abused:   . Sexually Abused:       PHYSICAL EXAM  Vitals:   08/10/19 0825  BP: 133/88  Pulse: 79  Temp: (!) 97.1 F (36.2 C)  Weight: 197 lb (89.4 kg)  Height: 5\' 10"  (1.778 m)   Body mass index is 28.27 kg/m.  Generalized: Well developed, in no acute distress  Cardiology: normal rate and rhythm, no murmur noted Respiratory: clear to auscultation bilaterally  Neurological examination  Mentation: Alert oriented to time, place, history taking. Follows all commands speech and language fluent Cranial nerve II-XII: Pupils were equal round reactive to light. Extraocular movements were full, visual field were full for the year Motor: The motor testing reveals 5 over 5 strength of all 4 extremities. Good symmetric motor tone is noted throughout.   Gait and station: Gait is normal.    DIAGNOSTIC DATA (LABS, IMAGING, TESTING) - I reviewed patient records, labs, notes, testing and imaging myself where available.  No flowsheet data found.   Lab Results  Component Value Date   WBC 11.2 (H) 11/30/2018   HGB 13.8 11/30/2018   HCT 41.5 11/30/2018   MCV 97.0 11/30/2018   PLT 305 11/30/2018       Component Value Date/Time   NA 140 11/30/2018 1441   K 4.5 11/30/2018 1441   CL 105 11/30/2018 1441   CO2 25 11/30/2018 1441   GLUCOSE 95 11/30/2018 1441   BUN 24 11/30/2018 1441   CREATININE 1.23 (H) 11/30/2018 1441   CALCIUM 9.9 11/30/2018 1441   No results found for: CHOL, HDL, LDLCALC, LDLDIRECT, TRIG, CHOLHDL No results found for: HGBA1C No results found for: VITAMINB12 No results found for: TSH     ASSESSMENT AND PLAN 74 y.o. year old male  has a past medical history of Atrial fibrillation ([place]), Essential hypertension, History of stroke (10/2017), and Hyperlipemia. here with     ICD-10-CM   1. Complex sleep apnea syndrome  G47.31 For home use only DME [facility]  2. Encounter for counseling on adaptive servo-ventilation (ASV) use  Z71.89 For home use only DME [facility]    Najay is doing fantastic on ASV therapy.  He feels that he has helped significantly with improving sleep quality and energy levels.  Compliance report reveals excellent compliance.  He was commended and encouraged to continue using ASV nightly and graded each night.  He will continue close follow-up with primary care and cardiology.  Healthy lifestyle habits encouraged.  He will return for follow-up in 1 year, sooner if needed.  He verbalizes understanding and agreement with this plan.   Orders Placed This Encounter  Procedures  . For home use only DME [facility]    Supplies for ASV    Order Specific Question:   Length of Need    Answer:   Lifetime    Order Specific Question:   Inspiratory pressure    Answer:   OTHER SEE COMMENTS    Order Specific Question:   Expiratory pressure    Answer:   OTHER SEE COMMENTS     No orders of the defined types were placed in this encounter.     I spent 15 minutes  with the patient. 50% of this time was spent counseling and educating patient on plan of care and medications.    Debbora Presto, FNP-C 08/10/2019, 9:18 AM [facility] [address] [place], [place] [address] [phone]

## 2019-08-10 ENCOUNTER — Ambulatory Visit: Payer: PPO | Admitting: Family Medicine

## 2019-08-10 ENCOUNTER — Other Ambulatory Visit: Payer: Self-pay

## 2019-08-10 ENCOUNTER — Other Ambulatory Visit: Payer: Self-pay | Admitting: Cardiology

## 2019-08-10 ENCOUNTER — Encounter: Payer: Self-pay | Admitting: Family Medicine

## 2019-08-10 VITALS — BP 133/88 | HR 79 | Temp 97.1°F | Ht 70.0 in | Wt 197.0 lb

## 2019-08-10 DIAGNOSIS — G4731 Primary central sleep apnea: Secondary | ICD-10-CM | POA: Diagnosis not present

## 2019-08-10 DIAGNOSIS — Z7189 Other specified counseling: Secondary | ICD-10-CM | POA: Diagnosis not present

## 2019-08-10 MED ORDER — METOPROLOL TARTRATE 100 MG PO TABS: 100.0000 mg | ORAL_TABLET | Freq: Two times a day (BID) | ORAL | 1 refills | Status: DC

## 2019-08-10 NOTE — Telephone Encounter (Signed)
*  STAT* If patient is at the pharmacy, call can be transferred to refill team.   1. Which medications need to be refilled? metoprolol (LOPRESSOR) 100 MG tablet   2. Which pharmacy/location (including street and city if local pharmacy) is medication to be sent to? [place]  3. Do they need a 30 day or 90 day supply?

## 2019-08-10 NOTE — Progress Notes (Signed)
DME order faxed to [facility]. Confirmation received.

## 2019-08-10 NOTE — Telephone Encounter (Signed)
Medication sent to pharmacy  

## 2019-08-16 ENCOUNTER — Telehealth: Payer: Self-pay | Admitting: Cardiology

## 2019-08-16 NOTE — Telephone Encounter (Signed)
°  Patient Consent for Virtual Visit         Douglas Lee has provided verbal consent on 08/16/2019 for a virtual visit (video or telephone).   CONSENT FOR VIRTUAL VISIT FOR:  Douglas Lee  By participating in this virtual visit I agree to the following:  I hereby voluntarily request, consent and authorize [facility] and its employed or contracted physicians, physician assistants, nurse practitioners or other licensed health care professionals (the Practitioner), to provide me with telemedicine health care services (the Services") as deemed necessary by the treating Practitioner. I acknowledge and consent to receive the Services by the Practitioner via telemedicine. I understand that the telemedicine visit will involve communicating with the Practitioner through live audiovisual communication technology and the disclosure of certain medical information by electronic transmission. I acknowledge that I have been given the opportunity to request an in-person assessment or other available alternative prior to the telemedicine visit and am voluntarily participating in the telemedicine visit.  I understand that I have the right to withhold or withdraw my consent to the use of telemedicine in the course of my care at any time, without affecting my right to future care or treatment, and that the Practitioner or I may terminate the telemedicine visit at any time. I understand that I have the right to inspect all information obtained and/or recorded in the course of the telemedicine visit and may receive copies of available information for a reasonable fee.  I understand that some of the potential risks of receiving the Services via telemedicine include:   Delay or interruption in medical evaluation due to technological equipment failure or disruption;  Information transmitted may not be sufficient (e.g. poor resolution of images) to allow for appropriate medical decision making by the Practitioner;  and/or   In rare instances, security protocols could fail, causing a breach of personal health information.  Furthermore, I acknowledge that it is my responsibility to provide information about my medical history, conditions and care that is complete and accurate to the best of my ability. I acknowledge that Practitioner's advice, recommendations, and/or decision may be based on factors not within their control, such as incomplete or inaccurate data provided by me or distortions of diagnostic images or specimens that may result from electronic transmissions. I understand that the practice of medicine is not an exact science and that Practitioner makes no warranties or guarantees regarding treatment outcomes. I acknowledge that a copy of this consent can be made available to me via my patient portal ([place]), or I can request a printed copy by calling the office of [facility].    I understand that my insurance will be billed for this visit.   I have read or had this consent read to me.  I understand the contents of this consent, which adequately explains the benefits and risks of the Services being provided via telemedicine.   I have been provided ample opportunity to ask questions regarding this consent and the Services and have had my questions answered to my satisfaction.  I give my informed consent for the services to be provided through the use of telemedicine in my medical care

## 2019-08-19 DIAGNOSIS — G4731 Primary central sleep apnea: Secondary | ICD-10-CM | POA: Diagnosis not present

## 2019-09-07 ENCOUNTER — Encounter: Payer: Self-pay | Admitting: Cardiology

## 2019-09-07 ENCOUNTER — Telehealth (INDEPENDENT_AMBULATORY_CARE_PROVIDER_SITE_OTHER): Payer: PPO | Admitting: Cardiology

## 2019-09-07 VITALS — Ht 70.0 in | Wt 197.0 lb

## 2019-09-07 DIAGNOSIS — E782 Mixed hyperlipidemia: Secondary | ICD-10-CM | POA: Diagnosis not present

## 2019-09-07 DIAGNOSIS — I4821 Permanent atrial fibrillation: Secondary | ICD-10-CM

## 2019-09-07 NOTE — Patient Instructions (Addendum)
Medication Instructions:    Your physician recommends that you continue on your current medications as directed. Please refer to the Current Medication list given to you today.  Labwork:  NONE  Testing/Procedures:  NONE  Follow-Up:  Your physician recommends that you schedule a follow-up appointment in: 6 months (office)  Any Other Special Instructions Will Be Listed Below (If Applicable).  If you need a refill on your cardiac medications before your next appointment, please call your pharmacy. 

## 2019-09-07 NOTE — Progress Notes (Signed)
Virtual Visit via Telephone Note   This visit type was conducted due to national recommendations for restrictions regarding the COVID-19 Pandemic (e.g. social distancing) in an effort to limit this patient's exposure and mitigate transmission in our community.  Due to his co-morbid illnesses, this patient is at least at moderate risk for complications without adequate follow up.  This format is felt to be most appropriate for this patient at this time.  The patient did not have access to video technology/had technical difficulties with video requiring transitioning to audio format only (telephone).  All issues noted in this document were discussed and addressed.  No physical exam could be performed with this format.  Please refer to the patient's chart for his  consent to telehealth for [facility].   The patient was identified using 2 identifiers.  Date:  09/07/2019   ID:  Douglas Lee, DOB April 09, 1945, MRN [id number]  Patient Location: Home Provider Location: Office  PCP:  Glenda Chroman, MD  Cardiologist:  Rozann Lesches, MD Electrophysiologist:  None   Evaluation Performed:  Follow-Up Visit  Chief Complaint:  Cardiac follow-up  History of Present Illness:    Douglas Lee is a 74 y.o. male last assessed via telehealth encounter in November 2020 by Mr. Leonides Sake NP.  We spoke by phone today.  He tells me that he has been doing very well, no significant palpitations or chest discomfort.  I reviewed his medications which are outlined below.  He reports no obvious intolerances.  Cardiac regimen includes Eliquis, Lopressor, and Crestor.  He states that he is due for lab work with PCP in the next few weeks.  He does not report any spontaneous bleeding problems on Eliquis.   Past Medical History:  Diagnosis Date  . Atrial fibrillation ([place])   . Essential hypertension   . History of stroke Q000111Q   Embolic, right parietal lobe and insular region  . Hyperlipemia    Past Surgical  History:  Procedure Laterality Date  . KNEE ARTHROTOMY Right   . MINOR HEMORRHOIDECTOMY  07/12/2015     Current Meds  Medication Sig  . apixaban (ELIQUIS) 5 MG TABS tablet Take 1 tablet (5 mg total) by mouth 2 (two) times daily.  . Coenzyme Q10 (CO Q-10 PO) Take 1 tablet by mouth daily.  Marland Kitchen L-LYSINE PO Take 1 tablet by mouth daily.  Marland Kitchen lisinopril (ZESTRIL) 10 MG tablet Take 1 tablet (10 mg total) by mouth daily.  . metoprolol tartrate (LOPRESSOR) 100 MG tablet Take 1 tablet (100 mg total) by mouth 2 (two) times daily.  . Multiple Vitamin (MULTIVITAMIN) tablet Take 1 tablet by mouth daily.  . Omega-3 Fatty Acids (FISH OIL) 1000 MG CAPS Take 1 capsule by mouth 3 (three) times daily.   . rosuvastatin (CRESTOR) 10 MG tablet Take 1 tablet (10 mg total) by mouth daily.     Allergies:   Lipitor [atorvastatin], Penicillins, and Sulfur   ROS:   No syncope.   Prior CV studies:   The following studies were reviewed today:  Echocardiogram 10/28/2017 [facility]): LVEF 60 to 65%, severe left atrial enlargement, moderate right atrial enlargement, sclerotic aortic valve with trivial aortic regurgitation, mild mitral regurgitation, mild tricuspid regurgitation, no pericardial effusion.  Labs/Other Tests and Data Reviewed:    EKG:  An ECG dated 10/17/2017 was personally reviewed today and demonstrated:  Atrial fibrillation with leftward axis and low voltage, decreased R wave progression.  Recent Labs: 11/30/2018: BUN 24; Creat 1.23; Hemoglobin  13.8; Platelets 305; Potassium 4.5; Sodium 140  October 2020: Cholesterol 211, triglycerides 160, HDL 63, LDL 120, BUN 14, creatinine 1.14, potassium 4.9, AST 25, ALT 19, hemoglobin 13.7, platelets 327, TSH 3.95  Wt Readings from Last 3 Encounters:  09/07/19 197 lb (89.4 kg)  08/10/19 197 lb (89.4 kg)  02/24/19 185 lb (83.9 kg)     Objective:    Vital Signs:  Ht 5\' 10"  (1.778 m)   Wt 197 lb (89.4 kg)   BMI 28.27 kg/m    Not able to obtain  vital signs. Patient spoke in full sentences, no audible cough or wheezing.  ASSESSMENT & PLAN:    1.  Permanent atrial fibrillation with CHA2DS2-VASc score of 5.  He remains on Eliquis and does not report any spontaneous bleeding problems.  He is due for follow-up lab work with PCP in the next few weeks.  Continue Lopressor for heart rate control.  He does not describe any palpitations.  2.  Mixed hyperlipidemia, tolerating Crestor at this point.  Last LDL was 120 in October 2020.  Recommended follow-up with PCP, ideally LDL should be under 70 with prior history of stroke.   Time:   Today, I have spent 10 minutes with the patient with telehealth technology discussing the above problems.     Medication Adjustments/Labs and Tests Ordered: Current medicines are reviewed at length with the patient today.  Concerns regarding medicines are outlined above.   Tests Ordered: No orders of the defined types were placed in this encounter.   Medication Changes: No orders of the defined types were placed in this encounter.   Follow Up:  In Person 6 months in the [place] office.  Signed, Rozann Lesches, MD  09/07/2019 11:40 AM    [place]

## 2019-09-14 DIAGNOSIS — I10 Essential (primary) hypertension: Secondary | ICD-10-CM | POA: Diagnosis not present

## 2019-09-14 DIAGNOSIS — E7800 Pure hypercholesterolemia, unspecified: Secondary | ICD-10-CM | POA: Diagnosis not present

## 2019-09-19 DIAGNOSIS — G4731 Primary central sleep apnea: Secondary | ICD-10-CM | POA: Diagnosis not present

## 2019-10-19 DIAGNOSIS — G4731 Primary central sleep apnea: Secondary | ICD-10-CM | POA: Diagnosis not present

## 2019-11-03 DIAGNOSIS — Z299 Encounter for prophylactic measures, unspecified: Secondary | ICD-10-CM | POA: Diagnosis not present

## 2019-11-03 DIAGNOSIS — I63411 Cerebral infarction due to embolism of right middle cerebral artery: Secondary | ICD-10-CM | POA: Diagnosis not present

## 2019-11-03 DIAGNOSIS — I10 Essential (primary) hypertension: Secondary | ICD-10-CM | POA: Diagnosis not present

## 2019-11-03 DIAGNOSIS — I4891 Unspecified atrial fibrillation: Secondary | ICD-10-CM | POA: Diagnosis not present

## 2019-11-03 DIAGNOSIS — L821 Other seborrheic keratosis: Secondary | ICD-10-CM | POA: Diagnosis not present

## 2019-11-19 DIAGNOSIS — G4731 Primary central sleep apnea: Secondary | ICD-10-CM | POA: Diagnosis not present

## 2019-12-20 DIAGNOSIS — G4731 Primary central sleep apnea: Secondary | ICD-10-CM | POA: Diagnosis not present

## 2019-12-21 ENCOUNTER — Encounter: Payer: Self-pay | Admitting: *Deleted

## 2019-12-21 ENCOUNTER — Telehealth: Payer: Self-pay | Admitting: Cardiology

## 2019-12-21 DIAGNOSIS — I480 Paroxysmal atrial fibrillation: Secondary | ICD-10-CM

## 2019-12-21 MED ORDER — APIXABAN 5 MG PO TABS: 5.0000 mg | ORAL_TABLET | Freq: Two times a day (BID) | ORAL | 2 refills | Status: DC

## 2019-12-21 NOTE — Telephone Encounter (Signed)
Patient requested to have Edrick Oh, RN. Did not want to leave a message.

## 2019-12-21 NOTE — Telephone Encounter (Signed)
Pt called for Eliquis refill.  Reviewed chart.  Last visit with Dr Domenic Polite was 09/2019.  Last labs were 09/14/19:  SCr 1.17  Weight 89.4kg  Age 74  CBC not available  Will F/U with PCP re. CBC.  Based on above results dose of Eliquis 5mg  bid is appropriate.  Refill approved.

## 2020-01-19 DIAGNOSIS — G4731 Primary central sleep apnea: Secondary | ICD-10-CM | POA: Diagnosis not present

## 2020-02-02 DIAGNOSIS — Z79899 Other long term (current) drug therapy: Secondary | ICD-10-CM | POA: Diagnosis not present

## 2020-02-02 DIAGNOSIS — Z1331 Encounter for screening for depression: Secondary | ICD-10-CM | POA: Diagnosis not present

## 2020-02-02 DIAGNOSIS — Z1339 Encounter for screening examination for other mental health and behavioral disorders: Secondary | ICD-10-CM | POA: Diagnosis not present

## 2020-02-02 DIAGNOSIS — Z6827 Body mass index (BMI) 27.0-27.9, adult: Secondary | ICD-10-CM | POA: Diagnosis not present

## 2020-02-02 DIAGNOSIS — E7800 Pure hypercholesterolemia, unspecified: Secondary | ICD-10-CM | POA: Diagnosis not present

## 2020-02-02 DIAGNOSIS — Z0000 Encounter for general adult medical examination without abnormal findings: Secondary | ICD-10-CM | POA: Diagnosis not present

## 2020-02-02 DIAGNOSIS — Z125 Encounter for screening for malignant neoplasm of prostate: Secondary | ICD-10-CM | POA: Diagnosis not present

## 2020-02-02 DIAGNOSIS — Z7189 Other specified counseling: Secondary | ICD-10-CM | POA: Diagnosis not present

## 2020-02-02 DIAGNOSIS — Z299 Encounter for prophylactic measures, unspecified: Secondary | ICD-10-CM | POA: Diagnosis not present

## 2020-02-02 DIAGNOSIS — R5383 Other fatigue: Secondary | ICD-10-CM | POA: Diagnosis not present

## 2020-02-02 DIAGNOSIS — I10 Essential (primary) hypertension: Secondary | ICD-10-CM | POA: Diagnosis not present

## 2020-02-04 LAB — TSH: TSH: 0.02 — AB (ref 0.41–5.90)

## 2020-02-10 DIAGNOSIS — R7989 Other specified abnormal findings of blood chemistry: Secondary | ICD-10-CM | POA: Diagnosis not present

## 2020-02-10 DIAGNOSIS — R739 Hyperglycemia, unspecified: Secondary | ICD-10-CM | POA: Diagnosis not present

## 2020-02-10 LAB — BASIC METABOLIC PANEL
BUN: 12 (ref 4–21)
Creatinine: 1 (ref 0.6–1.3)

## 2020-02-11 LAB — COMPREHENSIVE METABOLIC PANEL
GFR calc Af Amer: 81
GFR calc non Af Amer: 70

## 2020-02-13 ENCOUNTER — Telehealth: Payer: Self-pay | Admitting: *Deleted

## 2020-02-13 ENCOUNTER — Telehealth: Payer: Self-pay | Admitting: Cardiology

## 2020-02-13 DIAGNOSIS — I10 Essential (primary) hypertension: Secondary | ICD-10-CM

## 2020-02-13 MED ORDER — LISINOPRIL 10 MG PO TABS: 10.0000 mg | ORAL_TABLET | Freq: Every day | ORAL | 1 refills | Status: DC

## 2020-02-13 MED ORDER — METOPROLOL TARTRATE 100 MG PO TABS: 100.0000 mg | ORAL_TABLET | Freq: Two times a day (BID) | ORAL | 1 refills | Status: DC

## 2020-02-13 NOTE — Telephone Encounter (Signed)
Medication sent to pharmacy  

## 2020-02-13 NOTE — Telephone Encounter (Signed)
*  STAT* If patient is at the pharmacy, call can be transferred to refill team.   1. Which medications need to be refilled? (please list name of each medication and dose if known )  metoprolol tartrate (LOPRESSOR) 100 MG tablet [[id number]]   lisinopril (ZESTRIL) 10 MG tablet [[id number]]   2. Which pharmacy/location (including street and city if local pharmacy) is medication to be sent to?  [facility]  3. Do they need a 30 day or 90 day supply?   [place]

## 2020-02-13 NOTE — Telephone Encounter (Signed)
Error

## 2020-02-14 ENCOUNTER — Other Ambulatory Visit: Payer: Self-pay | Admitting: Cardiology

## 2020-02-14 DIAGNOSIS — E0590 Thyrotoxicosis, unspecified without thyrotoxic crisis or storm: Secondary | ICD-10-CM | POA: Diagnosis not present

## 2020-02-14 DIAGNOSIS — I639 Cerebral infarction, unspecified: Secondary | ICD-10-CM | POA: Diagnosis not present

## 2020-02-14 DIAGNOSIS — Z299 Encounter for prophylactic measures, unspecified: Secondary | ICD-10-CM | POA: Diagnosis not present

## 2020-02-14 DIAGNOSIS — I10 Essential (primary) hypertension: Secondary | ICD-10-CM | POA: Diagnosis not present

## 2020-02-14 DIAGNOSIS — I4891 Unspecified atrial fibrillation: Secondary | ICD-10-CM | POA: Diagnosis not present

## 2020-02-17 ENCOUNTER — Other Ambulatory Visit: Payer: Self-pay

## 2020-02-17 ENCOUNTER — Encounter: Payer: Self-pay | Admitting: "Endocrinology

## 2020-02-17 ENCOUNTER — Ambulatory Visit (INDEPENDENT_AMBULATORY_CARE_PROVIDER_SITE_OTHER): Payer: PPO | Admitting: "Endocrinology

## 2020-02-17 VITALS — BP 136/84 | HR 80 | Ht 70.0 in | Wt 193.6 lb

## 2020-02-17 DIAGNOSIS — E0590 Thyrotoxicosis, unspecified without thyrotoxic crisis or storm: Secondary | ICD-10-CM | POA: Diagnosis not present

## 2020-02-17 NOTE — Progress Notes (Signed)
02/17/2020     Endocrinology Consult Note    Subjective:    Patient ID: Douglas Lee, male    DOB: 14-Feb-1946, PCP Glenda Chroman, MD.   Past Medical History:  Diagnosis Date  . Atrial fibrillation ([place])   . Essential hypertension   . History of stroke 53/9767   Embolic, right parietal lobe and insular region  . Hyperlipemia     Past Surgical History:  Procedure Laterality Date  . COLONOSCOPY    . KNEE ARTHROTOMY Right   . MINOR HEMORRHOIDECTOMY  07/12/2015    Social History   Socioeconomic History  . Marital status: Married    Spouse name: Not on file  . Number of children: Not on file  . Years of education: Not on file  . Highest education level: Not on file  Occupational History  . Not on file  Tobacco Use  . Smoking status: Never Smoker  . Smokeless tobacco: Never Used  Vaping Use  . Vaping Use: Never used  Substance and Sexual Activity  . Alcohol use: No    Alcohol/week: 0.0 standard drinks  . Drug use: No  . Sexual activity: Not on file  Other Topics Concern  . Not on file  Social History Narrative  . Not on file   Social Determinants of Health   Financial Resource Strain:   . Difficulty of Paying Living Expenses: Not on file  Food Insecurity:   . Worried About Charity fundraiser in the Last Year: Not on file  . Ran Out of Food in the Last Year: Not on file  Transportation Needs:   . Lack of Transportation (Medical): Not on file  . Lack of Transportation (Non-Medical): Not on file  Physical Activity:   . Days of Exercise per Week: Not on file  . Minutes of Exercise per Session: Not on file  Stress:   . Feeling of Stress : Not on file  Social Connections:   . Frequency of Communication with Friends and Family: Not on file  . Frequency of Social Gatherings with Friends and Family: Not on file  . Attends Religious Services: Not on file  . Active Member of Clubs or Organizations: Not on file  . Attends Archivist  Meetings: Not on file  . Marital Status: Not on file    Family History  Problem Relation Age of Onset  . Heart attack Father 53  . Stroke Mother 59  . Hypertension Other   . Arrhythmia Brother        PPM    Outpatient Encounter Medications as of 02/17/2020  Medication Sig  . apixaban (ELIQUIS) 5 MG TABS tablet Take 1 tablet (5 mg total) by mouth 2 (two) times daily.  . Coenzyme Q10 (CO Q-10 PO) Take 1 tablet by mouth daily.  Marland Kitchen L-LYSINE PO Take 1 tablet by mouth daily.  Marland Kitchen lisinopril (ZESTRIL) 10 MG tablet Take 1 tablet (10 mg total) by mouth daily.  . metoprolol tartrate (LOPRESSOR) 100 MG tablet Take 1 tablet (100 mg total) by mouth 2 (two) times daily.  . Multiple Vitamin (MULTIVITAMIN) tablet Take 1 tablet by mouth daily.  . Omega-3 Fatty Acids (FISH OIL) 1000 MG CAPS Take 1 capsule by mouth 3 (three) times daily.   . rosuvastatin (CRESTOR) 10 MG tablet TAKE 1 TABLET BY MOUTH ONCE DAILY. STOP LOVASTATIN   No facility-administered encounter medications on file as of 02/17/2020.    ALLERGIES: Allergies  Allergen Reactions  .  Lipitor [Atorvastatin] Other (See Comments)  . Penicillins   . Sulfur     VACCINATION STATUS:  There is no immunization history on file for this patient.   HPI  Douglas Lee is 74 y.o. male who presents today with a medical history as above. he is being seen in consultation for hyperthyroidism requested by Glenda Chroman, MD.  he has been dealing with symptoms of weight loss, palpitations for several weeks.  He is on metoprolol 100 mg p.o. twice daily for atrial fibrillation. He was found to have lab work indicative of hyperthyroidism.  He is not on any antithyroid intervention at this time. he denies dysphagia, choking, shortness of breath, no recent voice change.    he denies family history of thyroid dysfunction nor any family history of thyroid cancer.  he denies personal history of goiter. he is not on any anti-thyroid medications nor on any  thyroid hormone supplements. he  is willing to proceed with appropriate work up and therapy for thyrotoxicosis.                           Review of systems  Constitutional: + weight loss, + fatigue, + subjective hyperthermia Eyes: no blurry vision, + xerophthalmia ENT: no sore throat, no nodules palpated in throat, no dysphagia/odynophagia, nor hoarseness Cardiovascular: no Chest Pain, no Shortness of Breath, +  palpitations, no leg swelling Respiratory: no cough, no SOB Gastrointestinal: no Nausea, no Vomiting, no Diarhhea Musculoskeletal: no muscle/joint aches Skin: no rashes Neurological: -  tremors, no numbness, no tingling, no dizziness Psychiatric: no depression, -  anxiety   Objective:    BP 136/84   Pulse 80   Ht 5\' 10"  (1.778 m)   Wt 193 lb 9.6 oz (87.8 kg)   BMI 27.78 kg/m   Wt Readings from Last 3 Encounters:  02/17/20 193 lb 9.6 oz (87.8 kg)  09/07/19 197 lb (89.4 kg)  08/10/19 197 lb (89.4 kg)                                                Physical exam  Constitutional: Body mass index is 27.78 kg/m., not in acute distress, + normal state of mind Eyes: PERRLA, EOMI, + exophthalmos ENT: moist mucous membranes, -  thyromegaly, no cervical lymphadenopathy Cardiovascular: - precordial activity, +tachycardic,  no Murmur/Rubs/Gallops Respiratory:  adequate breathing efforts, no gross chest deformity, Clear to auscultation bilaterally Gastrointestinal: abdomen soft, Non -tender, No distension, Bowel Sounds present Musculoskeletal: no gross deformities, strength intact in all four extremities Skin: moist, warm, no rashes Neurological: -  tremor with outstretched hands,  - Deep Tendon Reflexes  on both lower extremities.   CMP     Component Value Date/Time   NA 140 11/30/2018 1441   K 4.5 11/30/2018 1441   CL 105 11/30/2018 1441   CO2 25 11/30/2018 1441   GLUCOSE 95 11/30/2018 1441   BUN 12 02/10/2020 0000   CREATININE 1.0 02/10/2020 0000   CREATININE 1.23  (H) 11/30/2018 1441   CALCIUM 9.9 11/30/2018 1441   GFRNONAA 70 02/10/2020 0000   GFRAA 81 02/10/2020 0000     CBC    Component Value Date/Time   WBC 11.2 (H) 11/30/2018 1441   RBC 4.28 11/30/2018 1441   HGB 13.8 11/30/2018 1441   HCT 41.5 11/30/2018 1441  PLT 305 11/30/2018 1441   MCV 97.0 11/30/2018 1441   MCH 32.2 11/30/2018 1441   MCHC 33.3 11/30/2018 1441   RDW 12.2 11/30/2018 1441     Diabetic Labs (most recent): No results found for: HGBA1C  Lipid Panel  No results found for: CHOL, TRIG, HDL, CHOLHDL, VLDL, LDLCALC, LDLDIRECT, LABVLDL   Lab Results  Component Value Date   TSH 0.02 (A) 02/10/2020    February 13, 2020 lab work: Free T4 2.05-elevated, TSH 0.019-suppressed, total T4 14.5-elevated Labs on February 06, 2020: TSH 0.027 suppressed   Assessment & Plan:   1. Hyperthyroidism  he is being seen at a kind request of Vyas, Dhruv B, MD. his history and most recent labs are reviewed, and he was examined clinically. Subjective and objective findings are consistent with thyrotoxicosis likely from primary hyperthyroidism. The potential risks of untreated thyrotoxicosis and the need for definitive therapy have been discussed in detail with him, and he agrees to proceed with diagnostic workup and treatment plan.   I like to obtain confirmatory thyroid uptake and scan will be scheduled to be done as soon as possible.  He prefers to get this study close to home at [facility]. If he is found to have primary hyperthyroidism, he would benefit from definitive antithyroid intervention in light of his medical history of atrial fibrillation. Options of therapy are discussed with him.  We discussed the option of treating it with medications including methimazole or PTU which may have side effects including rash, transaminitis, and bone marrow suppression.  We  also discussed the option of definitive therapy with RAI ablation of the thyroid.   If  he is  found to have primary hyperthyroidism from Graves' disease , toxic multinodular goiter or toxic nodular goiter the preferred modality of treatment would be I-131 thyroid ablation.    -Patient is made aware of the high likelihood of post ablative hypothyroidism with subsequent need for lifelong thyroid hormone replacement. he  understands this outcome  and he is  willing to proceed.  Although surgery is one other choice of treatment in some cases, in his case surgery is not a good fit for presentation with only mild goiter.    he will return in 7 days for treatment decision.  His pulse rate is 80, currently on metoprolol 100 mg p.o. twice daily along with lisinopril, rosuvastatin, Eliquis.  I did not initiate any prescription for another beta-blocker for him.    -Patient is advised to maintain close follow up with Glenda Chroman, MD for primary care needs.   - Time spent with the patient: 60 minutes, of which >50% was spent in obtaining information about his symptoms, reviewing his previous labs, evaluations, and treatments, counseling him about his hyperthyroidism, and developing a plan to confirm the diagnosis and long term treatment as necessary. Please refer to " Patient Self Inventory" in the Media  tab for reviewed elements of pertinent patient history.  [name] participated in the discussions, expressed understanding, and voiced agreement with the above plans.  All questions were answered to his satisfaction. he is encouraged to contact clinic should he have any questions or concerns prior to his return visit.   Follow up plan: Return in about 1 week (around 02/24/2020) for F/U with Thyroid Uptake and Scan.   Thank you for involving me in the care of this pleasant patient, and I will continue to update you with his progress.  Glade Lloyd, MD Linna Hoff  [facility] Phone: [phone]  Fax: [phone]   02/17/2020, 11:30 AM  This note  was partially dictated with voice recognition software. Similar sounding words can be transcribed inadequately or may not  be corrected upon review.

## 2020-02-24 ENCOUNTER — Ambulatory Visit: Payer: PPO | Admitting: "Endocrinology

## 2020-02-27 DIAGNOSIS — E0590 Thyrotoxicosis, unspecified without thyrotoxic crisis or storm: Secondary | ICD-10-CM | POA: Diagnosis not present

## 2020-02-27 DIAGNOSIS — E049 Nontoxic goiter, unspecified: Secondary | ICD-10-CM | POA: Diagnosis not present

## 2020-03-06 ENCOUNTER — Encounter: Payer: Self-pay | Admitting: "Endocrinology

## 2020-03-06 ENCOUNTER — Ambulatory Visit (INDEPENDENT_AMBULATORY_CARE_PROVIDER_SITE_OTHER): Payer: PPO | Admitting: "Endocrinology

## 2020-03-06 ENCOUNTER — Other Ambulatory Visit: Payer: Self-pay

## 2020-03-06 VITALS — BP 152/86 | HR 81 | Ht 70.0 in

## 2020-03-06 DIAGNOSIS — E061 Subacute thyroiditis: Secondary | ICD-10-CM | POA: Diagnosis not present

## 2020-03-06 NOTE — Progress Notes (Signed)
03/06/2020      Endocrinology follow-up note   Subjective:    Patient ID: Douglas Lee, male    DOB: [birth year], PCP Glenda Chroman, MD.   Past Medical History:  Diagnosis Date  . Atrial fibrillation ([place])   . Essential hypertension   . History of stroke 19/6222   Embolic, right parietal lobe and insular region  . Hyperlipemia     Past Surgical History:  Procedure Laterality Date  . COLONOSCOPY    . KNEE ARTHROTOMY Right   . MINOR HEMORRHOIDECTOMY  07/12/2015    Social History   Socioeconomic History  . Marital status: Married    Spouse name: Not on file  . Number of children: Not on file  . Years of education: Not on file  . Highest education level: Not on file  Occupational History  . Not on file  Tobacco Use  . Smoking status: Never Smoker  . Smokeless tobacco: Never Used  Vaping Use  . Vaping Use: Never used  Substance and Sexual Activity  . Alcohol use: No    Alcohol/week: 0.0 standard drinks  . Drug use: No  . Sexual activity: Not on file  Other Topics Concern  . Not on file  Social History Narrative  . Not on file   Social Determinants of Health   Financial Resource Strain:   . Difficulty of Paying Living Expenses: Not on file  Food Insecurity:   . Worried About Charity fundraiser in the Last Year: Not on file  . Ran Out of Food in the Last Year: Not on file  Transportation Needs:   . Lack of Transportation (Medical): Not on file  . Lack of Transportation (Non-Medical): Not on file  Physical Activity:   . Days of Exercise per Week: Not on file  . Minutes of Exercise per Session: Not on file  Stress:   . Feeling of Stress : Not on file  Social Connections:   . Frequency of Communication with Friends and Family: Not on file  . Frequency of Social Gatherings with Friends and Family: Not on file  . Attends Religious Services: Not on file  . Active Member of Clubs or Organizations: Not on file  . Attends Archivist  Meetings: Not on file  . Marital Status: Not on file    Family History  Problem Relation Age of Onset  . Heart attack Father 31  . Stroke Mother 35  . Hypertension Other   . Arrhythmia Brother        PPM    Outpatient Encounter Medications as of 03/06/2020  Medication Sig  . apixaban (ELIQUIS) 5 MG TABS tablet Take 1 tablet (5 mg total) by mouth 2 (two) times daily.  . Coenzyme Q10 (CO Q-10 PO) Take 1 tablet by mouth daily.  Marland Kitchen L-LYSINE PO Take 1 tablet by mouth daily.  Marland Kitchen lisinopril (ZESTRIL) 10 MG tablet Take 1 tablet (10 mg total) by mouth daily.  . metoprolol tartrate (LOPRESSOR) 100 MG tablet Take 1 tablet (100 mg total) by mouth 2 (two) times daily.  . Multiple Vitamin (MULTIVITAMIN) tablet Take 1 tablet by mouth daily.  . Omega-3 Fatty Acids (FISH OIL) 1000 MG CAPS Take 1 capsule by mouth 3 (three) times daily.   . rosuvastatin (CRESTOR) 10 MG tablet TAKE 1 TABLET BY MOUTH ONCE DAILY. STOP LOVASTATIN   No facility-administered encounter medications on file as of 03/06/2020.    ALLERGIES: Allergies  Allergen Reactions  .  Lipitor [Atorvastatin] Other (See Comments)  . Penicillins   . Sulfur     VACCINATION STATUS:  There is no immunization history on file for this patient.   HPI  Douglas Lee is 74 y.o. male who presents today for follow-up after he was seen in consultation for  hyperthyroidism requested by Glenda Chroman, MD.  he has been dealing with symptoms of weight loss, palpitations for several weeks.  He is on metoprolol 100 mg p.o. twice daily for atrial fibrillation. He was found to have lab work indicative of hyperthyroidism.  His subsequent thyroid uptake and scan was consistent with subacute thyroiditis.  He has no new complaints today. he denies dysphagia, choking, shortness of breath, no recent voice change.    he denies family history of thyroid dysfunction nor any family history of thyroid cancer.  he denies personal history of goiter. he is not on  any anti-thyroid medications nor on any thyroid hormone supplements.                          Review of systems Limited as above.   Objective:    BP (!) 152/86   Pulse 81   Ht 5\' 10"  (1.778 m)   BMI 27.78 kg/m   Wt Readings from Last 3 Encounters:  02/17/20 193 lb 9.6 oz (87.8 kg)  09/07/19 197 lb (89.4 kg)  08/10/19 197 lb (89.4 kg)                                                Physical exam    CMP     Component Value Date/Time   NA 140 11/30/2018 1441   K 4.5 11/30/2018 1441   CL 105 11/30/2018 1441   CO2 25 11/30/2018 1441   GLUCOSE 95 11/30/2018 1441   BUN 12 02/10/2020 0000   CREATININE 1.0 02/10/2020 0000   CREATININE 1.23 (H) 11/30/2018 1441   CALCIUM 9.9 11/30/2018 1441   GFRNONAA 70 02/10/2020 0000   GFRAA 81 02/10/2020 0000     CBC    Component Value Date/Time   WBC 11.2 (H) 11/30/2018 1441   RBC 4.28 11/30/2018 1441   HGB 13.8 11/30/2018 1441   HCT 41.5 11/30/2018 1441   PLT 305 11/30/2018 1441   MCV 97.0 11/30/2018 1441   MCH 32.2 11/30/2018 1441   MCHC 33.3 11/30/2018 1441   RDW 12.2 11/30/2018 1441     Diabetic Labs (most recent): No results found for: HGBA1C  Lipid Panel  No results found for: CHOL, TRIG, HDL, CHOLHDL, VLDL, LDLCALC, LDLDIRECT, LABVLDL   Lab Results  Component Value Date   TSH 0.02 (A) 02/10/2020    February 13, 2020 lab work: Free T4 2.05-elevated, TSH 0.019-suppressed, total T4 14.5-elevated Labs on February 06, 2020: TSH 0.027 suppressed   Assessment & Plan:   1.  Subacute thyroiditis with transient thyrotoxicosis- He is thyroid uptake and scan ruled out primary hyperthyroidism.  He possibly had subacute thyroiditis with transient thyrotoxicosis.  This will not need antithyroid intervention.   He is already on beta-blocker related to his atrial fibrillation.  He will have repeat thyroid function test and office visit in 8-9 weeks.   His pulse rate is 81, currently on metoprolol 100 mg p.o. twice daily along  with lisinopril, rosuvastatin, Eliquis.  I did not  initiate any prescription for another beta-blocker for him.    -Patient is advised to maintain close follow up with Glenda Chroman, MD for primary care needs.     - Time spent on this patient care encounter:  20 minutes of which 50% was spent in  counseling and the rest reviewing  his current and  previous labs / studies and medications  doses and developing a plan for long term care. [name]  participated in the discussions, expressed understanding, and voiced agreement with the above plans.  All questions were answered to his satisfaction. he is encouraged to contact clinic should he have any questions or concerns prior to his return visit.   Follow up plan: Return in about 9 weeks (around 05/08/2020) for F/U with Pre-visit Labs.   Thank you for involving me in the care of this pleasant patient, and I will continue to update you with his progress.  Glade Lloyd, MD [facility] Phone: [phone]  Fax: [phone]   03/06/2020, 10:35 AM  This note was partially dictated with voice recognition software. Similar sounding words can be transcribed inadequately or may not  be corrected upon review.

## 2020-03-15 NOTE — Progress Notes (Signed)
Cardiology Office Note  Date: 03/16/2020   ID: Douglas Lee, Douglas Lee 08/14/1945, MRN [id number]  PCP:  Glenda Chroman, MD  Cardiologist:  Rozann Lesches, MD Electrophysiologist:  None   Chief Complaint  Patient presents with  . Follow-up    6 mth    History of Present Illness: Douglas Lee is a 74 y.o. male last assessed via telehealth encounter in June.  He presents for a follow-up visit.  Reports no palpitations at this time on medical therapy, functional with ADLs.  LDL in June had come down to 86 on Crestor.  He continues to follow at [facility].  He is also following with Dr. Dorris Fetch for management of hyperthyroidism.  I reviewed the recent notes.  I went over his medications today, refills provided.  I personally reviewed his ECG which shows rate controlled atrial fibrillation.  Past Medical History:  Diagnosis Date  . Atrial fibrillation ([place])   . Essential hypertension   . History of stroke 30/8657   Embolic, right parietal lobe and insular region  . Hyperlipemia     Past Surgical History:  Procedure Laterality Date  . COLONOSCOPY    . KNEE ARTHROTOMY Right   . MINOR HEMORRHOIDECTOMY  07/12/2015    Current Outpatient Medications  Medication Sig Dispense Refill  . Coenzyme Q10 (CO Q-10 PO) Take 1 tablet by mouth daily.    . Garlic 8469 MG CAPS Take 1,000 mg by mouth daily.    Marland Kitchen L-LYSINE PO Take 1 tablet by mouth daily.    . Multiple Vitamin (MULTIVITAMIN) tablet Take 1 tablet by mouth daily.    . Omega-3 Fatty Acids (FISH OIL) 1000 MG CAPS Take 1 capsule by mouth 3 (three) times daily.     . Vitamin A 2400 MCG (8000 UT) CAPS Take 1 capsule by mouth daily.    Marland Kitchen apixaban (ELIQUIS) 5 MG TABS tablet Take 1 tablet (5 mg total) by mouth 2 (two) times daily. 60 tablet 0  . lisinopril (ZESTRIL) 10 MG tablet Take 1 tablet (10 mg total) by mouth daily. 90 tablet 2  . metoprolol tartrate (LOPRESSOR) 100 MG tablet Take 1 tablet (100 mg total) by mouth 2  (two) times daily. 180 tablet 2  . rosuvastatin (CRESTOR) 10 MG tablet TAKE 1 TABLET BY MOUTH ONCE DAILY 90 tablet 2   No current facility-administered medications for this visit.   Allergies:  Lipitor [atorvastatin], Penicillins, and Sulfur   ROS: No syncope.  No spontaneous bleeding problems.  Physical Exam: VS:  BP 120/76   Pulse 62   Resp 16   Ht 5\' 10"  (1.778 m)   Wt 191 lb 6.4 oz (86.8 kg)   SpO2 97%   BMI 27.46 kg/m , BMI Body mass index is 27.46 kg/m.  Wt Readings from Last 3 Encounters:  03/16/20 191 lb 6.4 oz (86.8 kg)  02/17/20 193 lb 9.6 oz (87.8 kg)  09/07/19 197 lb (89.4 kg)    General: Patient appears comfortable at rest. HEENT: Conjunctiva and lids normal, wearing a mask. Neck: Supple, no elevated JVP or carotid bruits, no thyromegaly. Lungs: Clear to auscultation, nonlabored breathing at rest. Cardiac: Irregularly irregular, no S3 or significant systolic murmur. Extremities: No pitting edema.  ECG:  An ECG dated 10/17/2017 was personally reviewed today and demonstrated:  Atrial fibrillation with leftward axis and low voltage, decreased R wave progression.  Recent Labwork: 02/10/2020: BUN 12; Creatinine 1.0; TSH 0.07 September 2019: BUN 14, creatinine 1.17, potassium  4.6, AST 26, ALT 20, cholesterol 164, triglycerides 148, HDL 52, LDL 86  Other Studies Reviewed Today:  Echocardiogram 10/28/2017 [facility]): LVEF 60 to 65%, severe left atrial enlargement, moderate right atrial enlargement, sclerotic aortic valve with trivial aortic regurgitation, mild mitral regurgitation, mild tricuspid regurgitation, no pericardial effusion.  Assessment and Plan:  1.  Permanent atrial fibrillation, asymptomatic in terms of palpitations.  CHA2DS2-VASc score is 5 and he continues on Eliquis for stroke prophylaxis.  No change in Lopressor dosing.  ECG reviewed.  2.  Mixed hyperlipidemia, doing well on Crestor with decrease in LDL as noted above.  Keep follow-up at  [facility].  Medication Adjustments/Labs and Tests Ordered: Current medicines are reviewed at length with the patient today.  Concerns regarding medicines are outlined above.   Tests Ordered: No orders of the defined types were placed in this encounter.   Medication Changes: Meds ordered this encounter  Medications  . rosuvastatin (CRESTOR) 10 MG tablet    Sig: TAKE 1 TABLET BY MOUTH ONCE DAILY    Dispense:  90 tablet    Refill:  2  . metoprolol tartrate (LOPRESSOR) 100 MG tablet    Sig: Take 1 tablet (100 mg total) by mouth 2 (two) times daily.    Dispense:  180 tablet    Refill:  2  . lisinopril (ZESTRIL) 10 MG tablet    Sig: Take 1 tablet (10 mg total) by mouth daily.    Dispense:  90 tablet    Refill:  2  . DISCONTD: apixaban (ELIQUIS) 5 MG TABS tablet    Sig: Take 1 tablet (5 mg total) by mouth 2 (two) times daily.    Dispense:  180 tablet    Refill:  2  . apixaban (ELIQUIS) 5 MG TABS tablet    Sig: Take 1 tablet (5 mg total) by mouth 2 (two) times daily.    Dispense:  60 tablet    Refill:  0    Lot # [id number] Exp 01/2022    Disposition:  Follow up 1 year in the [place] office.  Signed, Satira Sark, MD, [facility] 03/16/2020 10:56 AM    [place] [facility] [place], [place], [place] [address] Phone: [phone]; Fax: [phone]

## 2020-03-16 ENCOUNTER — Other Ambulatory Visit: Payer: Self-pay

## 2020-03-16 ENCOUNTER — Encounter: Payer: Self-pay | Admitting: Cardiology

## 2020-03-16 ENCOUNTER — Ambulatory Visit (INDEPENDENT_AMBULATORY_CARE_PROVIDER_SITE_OTHER): Payer: PPO | Admitting: Cardiology

## 2020-03-16 VITALS — BP 120/76 | HR 62 | Resp 16 | Ht 70.0 in | Wt 191.4 lb

## 2020-03-16 DIAGNOSIS — I4821 Permanent atrial fibrillation: Secondary | ICD-10-CM

## 2020-03-16 DIAGNOSIS — I10 Essential (primary) hypertension: Secondary | ICD-10-CM | POA: Diagnosis not present

## 2020-03-16 DIAGNOSIS — I480 Paroxysmal atrial fibrillation: Secondary | ICD-10-CM

## 2020-03-16 MED ORDER — APIXABAN 5 MG PO TABS: 5.0000 mg | ORAL_TABLET | Freq: Two times a day (BID) | ORAL | 2 refills | Status: DC

## 2020-03-16 MED ORDER — APIXABAN 5 MG PO TABS: 5.0000 mg | ORAL_TABLET | Freq: Two times a day (BID) | ORAL | 0 refills | Status: DC

## 2020-03-16 MED ORDER — METOPROLOL TARTRATE 100 MG PO TABS: 100.0000 mg | ORAL_TABLET | Freq: Two times a day (BID) | ORAL | 2 refills | Status: DC

## 2020-03-16 MED ORDER — ROSUVASTATIN CALCIUM 10 MG PO TABS: ORAL_TABLET | ORAL | 2 refills | Status: DC

## 2020-03-16 MED ORDER — LISINOPRIL 10 MG PO TABS: 10.0000 mg | ORAL_TABLET | Freq: Every day | ORAL | 2 refills | Status: DC

## 2020-03-16 NOTE — Addendum Note (Signed)
Addended by: Julian Hy T on: 03/16/2020 01:40 PM   Modules accepted: Orders

## 2020-03-16 NOTE — Patient Instructions (Addendum)

## 2020-05-02 DIAGNOSIS — E061 Subacute thyroiditis: Secondary | ICD-10-CM | POA: Diagnosis not present

## 2020-05-02 LAB — TSH: TSH: 7.29 — AB (ref <=5.90)

## 2020-05-08 ENCOUNTER — Other Ambulatory Visit: Payer: Self-pay

## 2020-05-08 ENCOUNTER — Encounter: Payer: Self-pay | Admitting: "Endocrinology

## 2020-05-08 ENCOUNTER — Ambulatory Visit (INDEPENDENT_AMBULATORY_CARE_PROVIDER_SITE_OTHER): Payer: PPO | Admitting: "Endocrinology

## 2020-05-08 VITALS — BP 147/87 | HR 89 | Ht 70.0 in | Wt 194.2 lb

## 2020-05-08 DIAGNOSIS — E063 Autoimmune thyroiditis: Secondary | ICD-10-CM

## 2020-05-08 DIAGNOSIS — E038 Other specified hypothyroidism: Secondary | ICD-10-CM | POA: Diagnosis not present

## 2020-05-08 MED ORDER — LEVOTHYROXINE SODIUM 50 MCG PO TABS: 50.0000 ug | ORAL_TABLET | Freq: Every day | ORAL | 1 refills | Status: DC

## 2020-05-08 NOTE — Progress Notes (Signed)
05/08/2020       Endocrinology follow-up note    Subjective:    Patient ID: Douglas Lee, male    DOB: Dec 20, 1945, PCP Glenda Chroman, MD.   Past Medical History:  Diagnosis Date  . Atrial fibrillation ([place])   . Essential hypertension   . History of stroke 23/5361   Embolic, right parietal lobe and insular region  . Hyperlipemia     Past Surgical History:  Procedure Laterality Date  . COLONOSCOPY    . KNEE ARTHROTOMY Right   . MINOR HEMORRHOIDECTOMY  07/12/2015    Social History   Socioeconomic History  . Marital status: Married    Spouse name: Not on file  . Number of children: Not on file  . Years of education: Not on file  . Highest education level: Not on file  Occupational History  . Not on file  Tobacco Use  . Smoking status: Never Smoker  . Smokeless tobacco: Never Used  Vaping Use  . Vaping Use: Never used  Substance and Sexual Activity  . Alcohol use: No    Alcohol/week: 0.0 standard drinks  . Drug use: No  . Sexual activity: Not on file  Other Topics Concern  . Not on file  Social History Narrative  . Not on file   Social Determinants of Health   Financial Resource Strain: Not on file  Food Insecurity: Not on file  Transportation Needs: Not on file  Physical Activity: Not on file  Stress: Not on file  Social Connections: Not on file    Family History  Problem Relation Age of Onset  . Heart attack Father 37  . Stroke Mother 62  . Hypertension Other   . Arrhythmia Brother        PPM    Outpatient Encounter Medications as of 05/08/2020  Medication Sig  . apixaban (ELIQUIS) 5 MG TABS tablet Take 1 tablet (5 mg total) by mouth 2 (two) times daily.  . Coenzyme Q10 (CO Q-10 PO) Take 1 tablet by mouth daily.  . Garlic 4431 MG CAPS Take 1,000 mg by mouth daily.  Marland Kitchen L-LYSINE PO Take 1 tablet by mouth daily.  Marland Kitchen levothyroxine (SYNTHROID) 50 MCG tablet Take 1 tablet (50 mcg total) by mouth daily before breakfast.  . lisinopril  (ZESTRIL) 10 MG tablet Take 1 tablet (10 mg total) by mouth daily.  . metoprolol tartrate (LOPRESSOR) 100 MG tablet Take 1 tablet (100 mg total) by mouth 2 (two) times daily.  . Multiple Vitamin (MULTIVITAMIN) tablet Take 1 tablet by mouth daily.  . Omega-3 Fatty Acids (FISH OIL) 1000 MG CAPS Take 1 capsule by mouth 3 (three) times daily.   . rosuvastatin (CRESTOR) 10 MG tablet TAKE 1 TABLET BY MOUTH ONCE DAILY  . Vitamin A 2400 MCG (8000 UT) CAPS Take 1 capsule by mouth daily.   No facility-administered encounter medications on file as of 05/08/2020.    ALLERGIES: Allergies  Allergen Reactions  . Elemental Sulfur   . Lipitor [Atorvastatin] Other (See Comments)  . Penicillins     VACCINATION STATUS: Immunization History  Administered Date(s) Administered  . Influenza-Unspecified 01/07/2020  . Moderna Sars-Covid-2 Vaccination 05/21/2019, 06/18/2019, 02/10/2020     HPI  Douglas Lee is 75 y.o. male who returns with repeat thyroid function test after he was seen in consultation for abnormal thyroid function test. PMD: Jerene Bears B, MD.  During his last visit, he was found to have transient thyrotoxicosis from subacute thyroiditis.  His symptoms of presentation including palpitations, weight loss, tremors have largely resolved.  He still remains on metoprolol 100 mg p.o. twice daily for atrial fibrillation.  His previsit labs are consistent with primary hypothyroidism from Hashimoto's thyroiditis. Prior to his last visit, thyroid uptake and scan was consistent with  subacute thyroiditis.  He has no new complaints today. he denies dysphagia, choking, shortness of breath, no recent voice change.    he denies family history of thyroid dysfunction nor any family history of thyroid cancer.  he denies personal history of goiter. he is not on any anti-thyroid medications nor on any thyroid hormone supplements.                          Review of systems Limited as above.   Objective:     BP (!) 147/87 (BP Location: Right Arm, Patient Position: Sitting)   Pulse 89   Ht 5\' 10"  (1.778 m)   Wt 194 lb 3.2 oz (88.1 kg)   BMI 27.86 kg/m   Wt Readings from Last 3 Encounters:  05/08/20 194 lb 3.2 oz (88.1 kg)  03/16/20 191 lb 6.4 oz (86.8 kg)  02/17/20 193 lb 9.6 oz (87.8 kg)                                                Physical exam    CMP     Component Value Date/Time   NA 140 11/30/2018 1441   K 4.5 11/30/2018 1441   CL 105 11/30/2018 1441   CO2 25 11/30/2018 1441   GLUCOSE 95 11/30/2018 1441   BUN 12 02/10/2020 0000   CREATININE 1.0 02/10/2020 0000   CREATININE 1.23 (H) 11/30/2018 1441   CALCIUM 9.9 11/30/2018 1441   GFRNONAA 70 02/10/2020 0000   GFRAA 81 02/10/2020 0000     CBC    Component Value Date/Time   WBC 11.2 (H) 11/30/2018 1441   RBC 4.28 11/30/2018 1441   HGB 13.8 11/30/2018 1441   HCT 41.5 11/30/2018 1441   PLT 305 11/30/2018 1441   MCV 97.0 11/30/2018 1441   MCH 32.2 11/30/2018 1441   MCHC 33.3 11/30/2018 1441   RDW 12.2 11/30/2018 1441     Diabetic Labs (most recent): No results found for: HGBA1C  Lipid Panel  No results found for: CHOL, TRIG, HDL, CHOLHDL, VLDL, LDLCALC, LDLDIRECT, LABVLDL   Lab Results  Component Value Date   TSH 7.29 (A) 05/02/2020   TSH 0.02 (A) 02/02/2020    February 13, 2020 lab work: Free T4 2.05-elevated, TSH 0.019-suppressed, total T4 14.5-elevated Labs on February 06, 2020: TSH 0.027 suppressed   Assessment & Plan:   1.  Hypothyroidism due to Hashimoto's thyroiditis  His presentation now is consistent with hypothyroidism induced by Hashimoto's thyroiditis.  He would benefit from early initiation of levothyroxine.  I discussed and prescribe levothyroxine 50 mcg p.o. daily before breakfast.  - We discussed about the correct intake of his thyroid hormone, on empty stomach at fasting, with water, separated by at least 30 minutes from breakfast and other medications,  and separated by more than 4  hours from calcium, iron, multivitamins, acid reflux medications (PPIs). -Patient is made aware of the fact that thyroid hormone replacement is needed for life, dose to be adjusted by periodic monitoring of thyroid function tests.  He is already on beta-blocker related to his atrial fibrillation.  He is rate controlled at 89.  His blood pressure is slightly above target at 147/87.  He will have repeat thyroid function test and office visit in 7 weeks.    -Patient is advised to maintain close follow up with Glenda Chroman, MD for primary care needs.     - Time spent on this patient care encounter:  20 minutes of which 50% was spent in  counseling and the rest reviewing  his current and  previous labs / studies and medications  doses and developing a plan for long term care. [name]  participated in the discussions, expressed understanding, and voiced agreement with the above plans.  All questions were answered to his satisfaction. he is encouraged to contact clinic should he have any questions or concerns prior to his return visit.    Follow up plan: Return in about 7 weeks (around 06/26/2020) for F/U with Pre-visit Labs.   Thank you for involving me in the care of this pleasant patient, and I will continue to update you with his progress.  Glade Lloyd, MD [facility] Phone: [phone]  Fax: [phone]   05/08/2020, 10:26 AM  This note was partially dictated with voice recognition software. Similar sounding words can be transcribed inadequately or may not  be corrected upon review.

## 2020-05-10 DIAGNOSIS — I10 Essential (primary) hypertension: Secondary | ICD-10-CM | POA: Diagnosis not present

## 2020-05-10 DIAGNOSIS — I639 Cerebral infarction, unspecified: Secondary | ICD-10-CM | POA: Diagnosis not present

## 2020-05-10 DIAGNOSIS — I4891 Unspecified atrial fibrillation: Secondary | ICD-10-CM | POA: Diagnosis not present

## 2020-05-10 DIAGNOSIS — Z789 Other specified health status: Secondary | ICD-10-CM | POA: Diagnosis not present

## 2020-05-10 DIAGNOSIS — I63411 Cerebral infarction due to embolism of right middle cerebral artery: Secondary | ICD-10-CM | POA: Diagnosis not present

## 2020-05-10 DIAGNOSIS — Z299 Encounter for prophylactic measures, unspecified: Secondary | ICD-10-CM | POA: Diagnosis not present

## 2020-05-21 DIAGNOSIS — M79671 Pain in right foot: Secondary | ICD-10-CM | POA: Diagnosis not present

## 2020-05-21 DIAGNOSIS — M722 Plantar fascial fibromatosis: Secondary | ICD-10-CM | POA: Diagnosis not present

## 2020-06-16 DIAGNOSIS — Z8673 Personal history of transient ischemic attack (TIA), and cerebral infarction without residual deficits: Secondary | ICD-10-CM | POA: Diagnosis not present

## 2020-06-16 DIAGNOSIS — R42 Dizziness and giddiness: Secondary | ICD-10-CM | POA: Diagnosis not present

## 2020-06-16 DIAGNOSIS — Z79899 Other long term (current) drug therapy: Secondary | ICD-10-CM | POA: Diagnosis not present

## 2020-06-16 DIAGNOSIS — I10 Essential (primary) hypertension: Secondary | ICD-10-CM | POA: Diagnosis not present

## 2020-06-16 DIAGNOSIS — I4891 Unspecified atrial fibrillation: Secondary | ICD-10-CM | POA: Diagnosis not present

## 2020-06-16 DIAGNOSIS — E785 Hyperlipidemia, unspecified: Secondary | ICD-10-CM | POA: Diagnosis not present

## 2020-06-16 DIAGNOSIS — Z880 Allergy status to penicillin: Secondary | ICD-10-CM | POA: Diagnosis not present

## 2020-06-16 DIAGNOSIS — Z882 Allergy status to sulfonamides status: Secondary | ICD-10-CM | POA: Diagnosis not present

## 2020-06-18 DIAGNOSIS — Z299 Encounter for prophylactic measures, unspecified: Secondary | ICD-10-CM | POA: Diagnosis not present

## 2020-06-18 DIAGNOSIS — I4891 Unspecified atrial fibrillation: Secondary | ICD-10-CM | POA: Diagnosis not present

## 2020-06-18 DIAGNOSIS — I10 Essential (primary) hypertension: Secondary | ICD-10-CM | POA: Diagnosis not present

## 2020-06-18 DIAGNOSIS — I639 Cerebral infarction, unspecified: Secondary | ICD-10-CM | POA: Diagnosis not present

## 2020-06-18 DIAGNOSIS — I63411 Cerebral infarction due to embolism of right middle cerebral artery: Secondary | ICD-10-CM | POA: Diagnosis not present

## 2020-06-18 DIAGNOSIS — E063 Autoimmune thyroiditis: Secondary | ICD-10-CM | POA: Diagnosis not present

## 2020-06-18 LAB — TSH: TSH: 2.62 (ref 0.41–5.90)

## 2020-06-26 ENCOUNTER — Other Ambulatory Visit: Payer: Self-pay

## 2020-06-26 ENCOUNTER — Ambulatory Visit (INDEPENDENT_AMBULATORY_CARE_PROVIDER_SITE_OTHER): Payer: PPO | Admitting: "Endocrinology

## 2020-06-26 ENCOUNTER — Encounter: Payer: Self-pay | Admitting: "Endocrinology

## 2020-06-26 VITALS — BP 124/78 | HR 60 | Ht 70.0 in | Wt 195.8 lb

## 2020-06-26 DIAGNOSIS — E063 Autoimmune thyroiditis: Secondary | ICD-10-CM

## 2020-06-26 DIAGNOSIS — E038 Other specified hypothyroidism: Secondary | ICD-10-CM | POA: Diagnosis not present

## 2020-06-26 MED ORDER — LEVOTHYROXINE SODIUM 50 MCG PO TABS: 50.0000 ug | ORAL_TABLET | Freq: Every day | ORAL | 1 refills | Status: DC

## 2020-06-26 NOTE — Progress Notes (Signed)
06/26/2020    Endocrinology follow-up note   Subjective:    Patient ID: Douglas Lee, male    DOB: Oct 27, 1945, PCP Glenda Chroman, MD.   Past Medical History:  Diagnosis Date   Atrial fibrillation [facility])    Essential hypertension    History of stroke 15/4008   Embolic, right parietal lobe and insular region   Hyperlipemia     Past Surgical History:  Procedure Laterality Date   COLONOSCOPY     KNEE ARTHROTOMY Right    MINOR HEMORRHOIDECTOMY  07/12/2015    Social History   Socioeconomic History   Marital status: Married    Spouse name: Not on file   Number of children: Not on file   Years of education: Not on file   Highest education level: Not on file  Occupational History   Not on file  Tobacco Use   Smoking status: Never Smoker   Smokeless tobacco: Never Used  Vaping Use   Vaping Use: Never used  Substance and Sexual Activity   Alcohol use: No    Alcohol/week: 0.0 standard drinks   Drug use: No   Sexual activity: Not on file  Other Topics Concern   Not on file  Social History Narrative   Not on file   Social Determinants of Health   Financial Resource Strain: Not on file  Food Insecurity: Not on file  Transportation Needs: Not on file  Physical Activity: Not on file  Stress: Not on file  Social Connections: Not on file    Family History  Problem Relation Age of Onset   Heart attack Father 98   Stroke Mother 28   Hypertension Other    Arrhythmia Brother        PPM    Outpatient Encounter Medications as of 06/26/2020  Medication Sig   apixaban (ELIQUIS) 5 MG TABS tablet Take 1 tablet (5 mg total) by mouth 2 (two) times daily.   Coenzyme Q10 (CO Q-10 PO) Take 1 tablet by mouth daily.   Garlic 6761 MG CAPS Take 1,000 mg by mouth daily.   L-LYSINE PO Take 1 tablet by mouth daily.   levothyroxine (SYNTHROID) 50 MCG tablet Take 1 tablet (50 mcg total) by mouth daily before breakfast.   lisinopril (ZESTRIL) 10  MG tablet Take 1 tablet (10 mg total) by mouth daily. (Patient taking differently: Take 20 mg by mouth daily.)   metoprolol tartrate (LOPRESSOR) 100 MG tablet Take 1 tablet (100 mg total) by mouth 2 (two) times daily.   Multiple Vitamin (MULTIVITAMIN) tablet Take 1 tablet by mouth daily.   Omega-3 Fatty Acids (FISH OIL) 1000 MG CAPS Take 1 capsule by mouth 3 (three) times daily.    rosuvastatin (CRESTOR) 10 MG tablet TAKE 1 TABLET BY MOUTH ONCE DAILY   Vitamin A 2400 MCG (8000 UT) CAPS Take 1 capsule by mouth daily.   [DISCONTINUED] levothyroxine (SYNTHROID) 50 MCG tablet Take 1 tablet (50 mcg total) by mouth daily before breakfast.   No facility-administered encounter medications on file as of 06/26/2020.    ALLERGIES: Allergies  Allergen Reactions   Elemental Sulfur    Lipitor [Atorvastatin] Other (See Comments)   Penicillins     VACCINATION STATUS: Immunization History  Administered Date(s) Administered   Influenza-Unspecified 01/07/2020   Moderna Sars-Covid-2 Vaccination 05/21/2019, 06/18/2019, 02/10/2020     HPI  Douglas Lee is 75 y.o. male who returns for follow-up in the management of hypothyroidism with repeat thyroid function tests.  PMD: Glenda Chroman, MD.  During his prior visits he had subacute thyroiditis which resolved into partial hypothyroidism.  He was initiated on levothyroxine 50 mcg p.o. daily before breakfast.  He remains compliant and reports significant symptomatic improvement.   He has no new complaints today. Prior to his last visit, thyroid uptake and scan was consistent with  subacute thyroiditis.  He has no new complaints today. he denies dysphagia, choking, shortness of breath, no recent voice change.    he denies family history of thyroid dysfunction nor any family history of thyroid cancer.  he denies personal history of goiter. he is not on any anti-thyroid medications nor on any thyroid hormone supplements.                           Review of systems Limited as above.   Objective:    BP 124/78    Pulse 60    Ht 5\' 10"  (1.778 m)    Wt 195 lb 12.8 oz (88.8 kg)    BMI 28.09 kg/m   Wt Readings from Last 3 Encounters:  06/26/20 195 lb 12.8 oz (88.8 kg)  05/08/20 194 lb 3.2 oz (88.1 kg)  03/16/20 191 lb 6.4 oz (86.8 kg)                                                Physical exam    CMP     Component Value Date/Time   NA 140 11/30/2018 1441   K 4.5 11/30/2018 1441   CL 105 11/30/2018 1441   CO2 25 11/30/2018 1441   GLUCOSE 95 11/30/2018 1441   BUN 12 02/10/2020 0000   CREATININE 1.0 02/10/2020 0000   CREATININE 1.23 (H) 11/30/2018 1441   CALCIUM 9.9 11/30/2018 1441   GFRNONAA 70 02/10/2020 0000   GFRAA 81 02/10/2020 0000     CBC    Component Value Date/Time   WBC 11.2 (H) 11/30/2018 1441   RBC 4.28 11/30/2018 1441   HGB 13.8 11/30/2018 1441   HCT 41.5 11/30/2018 1441   PLT 305 11/30/2018 1441   MCV 97.0 11/30/2018 1441   MCH 32.2 11/30/2018 1441   MCHC 33.3 11/30/2018 1441   RDW 12.2 11/30/2018 1441     Diabetic Labs (most recent): No results found for: HGBA1C  Lipid Panel  No results found for: CHOL, TRIG, HDL, CHOLHDL, VLDL, LDLCALC, LDLDIRECT, LABVLDL   Lab Results  Component Value Date   TSH 2.62 06/18/2020   TSH 7.29 (A) 05/02/2020   TSH 0.02 (A) 02/02/2020    February 13, 2020 lab work: Free T4 2.05-elevated, TSH 0.019-suppressed, total T4 14.5-elevated Labs on February 06, 2020: TSH 0.027 suppressed   Assessment & Plan:   1.  Hypothyroidism due to Hashimoto's thyroiditis  His presentation now is consistent with appropriate replacement.  He is advised to continue levothyroxine 50 mcg p.o. daily before breakfast.    - We discussed about the correct intake of his thyroid hormone, on empty stomach at fasting, with water, separated by at least 30 minutes from breakfast and other medications,  and separated by more than 4 hours from calcium, iron, multivitamins, acid reflux  medications (PPIs). -Patient is made aware of the fact that thyroid hormone replacement is needed for life, dose to be adjusted by periodic monitoring of thyroid function  tests.   He is already on beta-blocker related to his atrial fibrillation.  He is rate controlled at  60-89.  His blood pressure is at target at 124/78.     -Patient is advised to maintain close follow up with Glenda Chroman, MD for primary care needs.      - Time spent on this patient care encounter:  30 minutes of which 50% was spent in  counseling and the rest reviewing  his current and  previous labs / studies and medications  doses and developing a plan for long term care, and documenting this care. [name]  participated in the discussions, expressed understanding, and voiced agreement with the above plans.  All questions were answered to his satisfaction. he is encouraged to contact clinic should he have any questions or concerns prior to his return visit.    Follow up plan: Return in about 6 months (around 12/27/2020) for F/U with Pre-visit Labs.   Thank you for involving me in the care of this pleasant patient, and I will continue to update you with his progress.  Glade Lloyd, MD [facility] Phone: [phone]  Fax: [phone]   06/26/2020, 11:50 AM  This note was partially dictated with voice recognition software. Similar sounding words can be transcribed inadequately or may not  be corrected upon review.

## 2020-07-05 DIAGNOSIS — I10 Essential (primary) hypertension: Secondary | ICD-10-CM | POA: Diagnosis not present

## 2020-07-10 DIAGNOSIS — I63411 Cerebral infarction due to embolism of right middle cerebral artery: Secondary | ICD-10-CM | POA: Diagnosis not present

## 2020-07-10 DIAGNOSIS — I10 Essential (primary) hypertension: Secondary | ICD-10-CM | POA: Diagnosis not present

## 2020-07-10 DIAGNOSIS — I4891 Unspecified atrial fibrillation: Secondary | ICD-10-CM | POA: Diagnosis not present

## 2020-07-10 DIAGNOSIS — E7800 Pure hypercholesterolemia, unspecified: Secondary | ICD-10-CM | POA: Diagnosis not present

## 2020-07-10 DIAGNOSIS — Z299 Encounter for prophylactic measures, unspecified: Secondary | ICD-10-CM | POA: Diagnosis not present

## 2020-07-30 DIAGNOSIS — I10 Essential (primary) hypertension: Secondary | ICD-10-CM | POA: Diagnosis not present

## 2020-07-30 DIAGNOSIS — Z299 Encounter for prophylactic measures, unspecified: Secondary | ICD-10-CM | POA: Diagnosis not present

## 2020-07-30 DIAGNOSIS — I4891 Unspecified atrial fibrillation: Secondary | ICD-10-CM | POA: Diagnosis not present

## 2020-08-03 DIAGNOSIS — I10 Essential (primary) hypertension: Secondary | ICD-10-CM | POA: Diagnosis not present

## 2020-08-04 DIAGNOSIS — S71151A Open bite, right thigh, initial encounter: Secondary | ICD-10-CM | POA: Diagnosis not present

## 2020-08-04 DIAGNOSIS — W540XXA Bitten by dog, initial encounter: Secondary | ICD-10-CM | POA: Diagnosis not present

## 2020-08-04 DIAGNOSIS — Z23 Encounter for immunization: Secondary | ICD-10-CM | POA: Diagnosis not present

## 2020-08-09 ENCOUNTER — Telehealth: Payer: Self-pay | Admitting: Cardiology

## 2020-08-09 MED ORDER — ROSUVASTATIN CALCIUM 10 MG PO TABS: ORAL_TABLET | ORAL | 3 refills | Status: DC

## 2020-08-09 MED ORDER — METOPROLOL TARTRATE 100 MG PO TABS: 100.0000 mg | ORAL_TABLET | Freq: Two times a day (BID) | ORAL | 3 refills | Status: DC

## 2020-08-09 NOTE — Telephone Encounter (Signed)
Patient would like a call back about his medication.

## 2020-08-09 NOTE — Patient Instructions (Addendum)
Please continue using your ASV regularly. While your insurance requires that you use ASV at least 4 hours each night on 70% of the nights, I recommend, that you not skip any nights and use it throughout the night if you can. Getting used to ASV and staying with the treatment long term does take time and patience and discipline. Untreated obstructive sleep apnea when it is moderate to severe can have an adverse impact on cardiovascular health and raise her risk for heart disease, arrhythmias, hypertension, congestive heart failure, stroke and diabetes. Untreated obstructive sleep apnea causes sleep disruption, nonrestorative sleep, and sleep deprivation. This can have an impact on your day to day functioning and cause daytime sleepiness and impairment of cognitive function, memory loss, mood disturbance, and problems focussing. Using ASV regularly can improve these symptoms.  Follow up in 1 year  Sleep Apnea Sleep apnea affects breathing during sleep. It causes breathing to stop for a short time or to become shallow. It can also increase the risk of:  Heart attack.  Stroke.  Being very overweight (obese).  Diabetes.  Heart failure.  Irregular heartbeat. The goal of treatment is to help you breathe normally again. What are the causes? There are three kinds of sleep apnea:  Obstructive sleep apnea. This is caused by a blocked or collapsed airway.  Central sleep apnea. This happens when the brain does not send the right signals to the muscles that control breathing.  Mixed sleep apnea. This is a combination of obstructive and central sleep apnea. The most common cause of this condition is a collapsed or blocked airway. This can happen if:  Your throat muscles are too relaxed.  Your tongue and tonsils are too large.  You are overweight.  Your airway is too small.   What increases the risk?  Being overweight.  Smoking.  Having a small airway.  Being older.  Being  male.  Drinking alcohol.  Taking medicines to calm yourself (sedatives or tranquilizers).  Having family members with the condition. What are the signs or symptoms?  Trouble staying asleep.  Being sleepy or tired during the day.  Getting angry a lot.  Loud snoring.  Headaches in the morning.  Not being able to focus your mind (concentrate).  Forgetting things.  Less interest in sex.  Mood swings.  Personality changes.  Feelings of sadness (depression).  Waking up a lot during the night to pee (urinate).  Dry mouth.  Sore throat. How is this diagnosed?  Your medical history.  A physical exam.  A test that is done when you are sleeping (sleep study). The test is most often done in a sleep lab but may also be done at home. How is this treated?  Sleeping on your side.  Using a medicine to get rid of mucus in your nose (decongestant).  Avoiding the use of alcohol, medicines to help you relax, or certain pain medicines (narcotics).  Losing weight, if needed.  Changing your diet.  Not smoking.  Using a machine to open your airway while you sleep, such as: ? An oral appliance. This is a mouthpiece that shifts your lower jaw forward. ? A CPAP device. This device blows air through a mask when you breathe out (exhale). ? An EPAP device. This has valves that you put in each nostril. ? A BPAP device. This device blows air through a mask when you breathe in (inhale) and breathe out.  Having surgery if other treatments do not work. It is important  to get treatment for sleep apnea. Without treatment, it can lead to:  High blood pressure.  Coronary artery disease.  In men, not being able to have an erection (impotence).  Reduced thinking ability.   Follow these instructions at home: Lifestyle  Make changes that your doctor recommends.  Eat a healthy diet.  Lose weight if needed.  Avoid alcohol, medicines to help you relax, and some pain  medicines.  Do not use any products that contain nicotine or tobacco, such as cigarettes, e-cigarettes, and chewing tobacco. If you need help quitting, ask your doctor. General instructions  Take over-the-counter and prescription medicines only as told by your doctor.  If you were given a machine to use while you sleep, use it only as told by your doctor.  If you are having surgery, make sure to tell your doctor you have sleep apnea. You may need to bring your device with you.  Keep all follow-up visits as told by your doctor. This is important. Contact a doctor if:  The machine that you were given to use during sleep bothers you or does not seem to be working.  You do not get better.  You get worse. Get help right away if:  Your chest hurts.  You have trouble breathing in enough air.  You have an uncomfortable feeling in your back, arms, or stomach.  You have trouble talking.  One side of your body feels weak.  A part of your face is hanging down. These symptoms may be an emergency. Do not wait to see if the symptoms will go away. Get medical help right away. Call your local emergency services (911 in the [place] Do not drive yourself to the hospital. Summary  This condition affects breathing during sleep.  The most common cause is a collapsed or blocked airway.  The goal of treatment is to help you breathe normally while you sleep. This information is not intended to replace advice given to you by your health care provider. Make sure you discuss any questions you have with your health care provider. Document Revised: 01/08/2018 Document Reviewed: 11/17/2017 Elsevier Patient Education  [place].

## 2020-08-09 NOTE — Telephone Encounter (Signed)
Patient requesting refills on Lopressor & Crestor.  Refills given for 90 day supply.  Sent to [facility] now.

## 2020-08-09 NOTE — Progress Notes (Signed)
PATIENT: Douglas Lee DOB: 07/21/1945  REASON FOR VISIT: follow up HISTORY FROM: patient  Chief Complaint  Patient presents with  . Follow-up    Rm 1, alone, pt states he has been sleeping better, reports no leaks or concerns.      HISTORY OF PRESENT ILLNESS: 08/13/20 ALL:  Douglas Lee returns for folow up for central sleep apnea managed with ASV. He continues to do very well with therapy. He is using ASV nightly. He is sleeping well. He is using a FFM. No leaks.     08/10/2019 ALL:  Douglas Lee is a 75 y.o. male here today for follow up of central sleep apnea on ASV therapy.  He continues to note significant improvements in how he feels.  He wakes in the morning full of energy.  He is very happy using ASV therapy.  He denies any concerns with his machine or supplies.  Compliance report dated 07/10/2019 through 08/08/2019 reveals that he used ASV therapy 30 of the past 30 days for compliance of 100%.  He used ASV greater than 4 hours all 30 days for compliance of 100%.  Average usage was 7 hours and 42 minutes.  Residual AHI is 3.5 with a EPAP of 5 cm of water minimum pressure support of 3 cm of water and maximum pressure support of 15 cm of water.  There was no leak noted.   HISTORY: (copied from my note on 02/09/2019)  Douglas Lee is a 75 y.o. male here today for follow up for central sleep apnea on ASV. He was previously treated with CPAP therapy but continued to have apneic events and felt that he was not getting enough air with CPAP. Compliance report reviewed and showed central apnea >19/hr. He was switched to ASV following titration study. He reports that he is feeing fantastic. He notes a significant improvement in sleep quality and increased energy levels. He denies any concerns with ASV.   Compliance report dated 01/08/2019 through 02/06/2019 reveals that he is using ASV nightly for compliance 100%.  He is using ASV greater than 4 hours every night for compliance of 100%.   Average usage was 7 hours and 54 minutes.  AHI is now 5.8 with EPAP of 5 cm of water, minimum pressure support of 3 cm of water and maximum pressure support of 15 cm of water.  There is no significant leak noted.  HISTORY: (copied from my note on 10/05/2018)  Douglas T Turneris a 75 y.o.malehere today for follow up for OSA on CPAP.He reports that he has been using his CPAP machine every night. He is frustrated as his mask does not fit well. He continues to note a significant leak when using his mask. He has reached out to [place] who was very helpful, however, they were unable to test the fit due to the pandemic. He has a mustache and feels that this is inhibiting a good seal. He does report benefit from using the machine. He feels that he is resting a little better. AHI remains elevated and significant leak is noted on the following download report.   09/05/2018 - 10/04/2018  Compliance Report Usage 09/05/2018 - 10/04/2018 Usage days 29/30 days (97%) >= 4 hours 18 days (60%) < 4 hours 11 days (37%) Usage hours 131 hours 56 minutes Average usage (total days) 4 hours 24 minutes Average usage (days used) 4 hours 33 minutes Median usage (days used) 4 hours 53 minutes Total used hours (value since last  reset - 10/04/2018) 142 hours AirSense 10 AutoSet Serial number [id number] Mode AutoSet Min Pressure 5 cmH2O Max Pressure 16 cmH2O EPR Fulltime EPR level 2 Response Standard Therapy Pressure - cmH2O Median: 6.3 95th percentile: 8.8 Maximum: 9.8 Leaks - L/min Median: 28.4 95th percentile: 49.6 Maximum: 61.1 Events per hour AI: 30.3 HI: 0.4 AHI: 30.7 Apnea Index Central: 7.9 Obstructive: 1.8 Unknown: 20.5 RERA Index 0.6 Cheyne-Stokes respiration (average duration per night) 1 hours 7 minutes (21%)   REVIEW OF SYSTEMS: Out of a complete 14 system review of symptoms, the patient complains only of the following symptoms, none and all other reviewed systems are  negative.  ESS: 1   ALLERGIES: Allergies  Allergen Reactions  . Elemental Sulfur   . Lipitor [Atorvastatin] Other (See Comments)  . Penicillins     HOME MEDICATIONS: Outpatient Medications Prior to Visit  Medication Sig Dispense Refill  . apixaban (ELIQUIS) 5 MG TABS tablet Take 1 tablet (5 mg total) by mouth 2 (two) times daily. 60 tablet 0  . Coenzyme Q10 (CO Q-10 PO) Take 1 tablet by mouth daily.    . Garlic 123XX123 MG CAPS Take 1,000 mg by mouth daily.    Douglas Lee Kitchen L-LYSINE PO Take 1 tablet by mouth daily.    Douglas Lee Kitchen levothyroxine (SYNTHROID) 50 MCG tablet Take 1 tablet (50 mcg total) by mouth daily before breakfast. 90 tablet 1  . lisinopril (ZESTRIL) 20 MG tablet Take 1 tablet by mouth daily.    . metoprolol tartrate (LOPRESSOR) 100 MG tablet Take 1 tablet (100 mg total) by mouth 2 (two) times daily. 180 tablet 3  . Multiple Vitamin (MULTIVITAMIN) tablet Take 1 tablet by mouth daily.    . Omega-3 Fatty Acids (FISH OIL) 1000 MG CAPS Take 1 capsule by mouth 3 (three) times daily.     . rosuvastatin (CRESTOR) 10 MG tablet TAKE 1 TABLET BY MOUTH ONCE DAILY 90 tablet 3  . Vitamin A 2400 MCG (8000 UT) CAPS Take 1 capsule by mouth daily.    Douglas Lee Kitchen lisinopril (ZESTRIL) 10 MG tablet Take 1 tablet (10 mg total) by mouth daily. (Patient taking differently: Take 20 mg by mouth daily.) 90 tablet 2   No facility-administered medications prior to visit.    PAST MEDICAL HISTORY: Past Medical History:  Diagnosis Date  . Atrial fibrillation ([place])   . Essential hypertension   . History of stroke [id number]   Embolic, right parietal lobe and insular region  . Hyperlipemia     PAST SURGICAL HISTORY: Past Surgical History:  Procedure Laterality Date  . COLONOSCOPY    . KNEE ARTHROTOMY Right   . MINOR HEMORRHOIDECTOMY  07/12/2015    FAMILY HISTORY: Family History  Problem Relation Age of Onset  . Heart attack Father 54  . Stroke Mother 12  . Hypertension Other   . Arrhythmia Brother        PPM     SOCIAL HISTORY: Social History   Socioeconomic History  . Marital status: Married    Spouse name: Not on file  . Number of children: Not on file  . Years of education: Not on file  . Highest education level: Not on file  Occupational History  . Not on file  Tobacco Use  . Smoking status: Never Smoker  . Smokeless tobacco: Never Used  Vaping Use  . Vaping Use: Never used  Substance and Sexual Activity  . Alcohol use: No    Alcohol/week: 0.0 standard drinks  . Drug use: No  . Sexual  activity: Not on file  Other Topics Concern  . Not on file  Social History Narrative  . Not on file   Social Determinants of Health   Financial Resource Strain: Not on file  Food Insecurity: Not on file  Transportation Needs: Not on file  Physical Activity: Not on file  Stress: Not on file  Social Connections: Not on file  Intimate Partner Violence: Not on file      PHYSICAL EXAM  Vitals:   08/13/20 0740  BP: 134/85  Pulse: 64  Weight: 194 lb (88 kg)  Height: 5\' 10"  (1.778 m)   Body mass index is 27.84 kg/m.  Generalized: Well developed, in no acute distress  Cardiology: normal rate and rhythm, no murmur noted Respiratory: clear to auscultation bilaterally  Neurological examination  Mentation: Alert oriented to time, place, history taking. Follows all commands speech and language fluent Cranial nerve II-XII: Pupils were equal round reactive to light. Extraocular movements were full, visual field were full for the year Motor: The motor testing reveals 5 over 5 strength of all 4 extremities. Good symmetric motor tone is noted throughout.   Gait and station: Gait is normal.    DIAGNOSTIC DATA (LABS, IMAGING, TESTING) - I reviewed patient records, labs, notes, testing and imaging myself where available.  No flowsheet data found.   Lab Results  Component Value Date   WBC 11.2 (H) 11/30/2018   HGB 13.8 11/30/2018   HCT 41.5 11/30/2018   MCV 97.0 11/30/2018   PLT 305  11/30/2018      Component Value Date/Time   NA 140 11/30/2018 1441   K 4.5 11/30/2018 1441   CL 105 11/30/2018 1441   CO2 25 11/30/2018 1441   GLUCOSE 95 11/30/2018 1441   BUN 12 02/10/2020 0000   CREATININE 1.0 02/10/2020 0000   CREATININE 1.23 (H) 11/30/2018 1441   CALCIUM 9.9 11/30/2018 1441   GFRNONAA 70 02/10/2020 0000   GFRAA 81 02/10/2020 0000   No results found for: CHOL, HDL, LDLCALC, LDLDIRECT, TRIG, CHOLHDL No results found for: HGBA1C No results found for: VITAMINB12 Lab Results  Component Value Date   TSH 2.62 06/18/2020       ASSESSMENT AND PLAN 75 y.o. year old male  has a past medical history of Atrial fibrillation ([place]), Essential hypertension, History of stroke (10/2017), and Hyperlipemia. here with     ICD-10-CM   1. Complex sleep apnea syndrome  G47.31 For home use only DME Bipap  2. Encounter for counseling on adaptive servo-ventilation (ASV) use  Z71.89 For home use only DME Bipap     Tevita is doing fantastic on ASV therapy. Compliance report reveals excellent compliance.  He was commended and encouraged to continue using ASV nightly and graded each night.  He will continue close follow-up with primary care and cardiology.  Healthy lifestyle habits encouraged.  He will return for follow-up in 1 year, sooner if needed.  He verbalizes understanding and agreement with this plan.   Orders Placed This Encounter  Procedures  . For home use only DME Bipap    Supplies    Order Specific Question:   Length of Need    Answer:   Lifetime    Order Specific Question:   Inspiratory pressure    Answer:   OTHER SEE COMMENTS    Order Specific Question:   Expiratory pressure    Answer:   OTHER SEE COMMENTS     No orders of the defined types were placed in this  encounter.        Debbora Presto, FNP-C 08/13/2020, 7:57 AM [facility] [address] [place], [place] [address] [phone]

## 2020-08-13 ENCOUNTER — Encounter: Payer: Self-pay | Admitting: Family Medicine

## 2020-08-13 ENCOUNTER — Ambulatory Visit: Payer: PPO | Admitting: Family Medicine

## 2020-08-13 VITALS — BP 134/85 | HR 64 | Ht 70.0 in | Wt 194.0 lb

## 2020-08-13 DIAGNOSIS — Z7189 Other specified counseling: Secondary | ICD-10-CM

## 2020-08-13 DIAGNOSIS — G4731 Primary central sleep apnea: Secondary | ICD-10-CM | POA: Diagnosis not present

## 2020-08-13 NOTE — Progress Notes (Signed)
Orders faxed to DME °

## 2020-09-04 DIAGNOSIS — I10 Essential (primary) hypertension: Secondary | ICD-10-CM | POA: Diagnosis not present

## 2020-09-05 DIAGNOSIS — H524 Presbyopia: Secondary | ICD-10-CM | POA: Diagnosis not present

## 2020-09-17 ENCOUNTER — Ambulatory Visit: Payer: PPO

## 2020-09-17 ENCOUNTER — Other Ambulatory Visit: Payer: Self-pay

## 2020-09-17 ENCOUNTER — Encounter: Payer: Self-pay | Admitting: Orthopedic Surgery

## 2020-09-17 ENCOUNTER — Ambulatory Visit: Payer: PPO | Admitting: Orthopedic Surgery

## 2020-09-17 VITALS — BP 149/86 | HR 65 | Ht 70.0 in | Wt 192.0 lb

## 2020-09-17 DIAGNOSIS — M25562 Pain in left knee: Secondary | ICD-10-CM

## 2020-09-17 NOTE — Progress Notes (Signed)
New patient   Chief Complaint  Patient presents with   Knee Pain    Left knee, was kneeled on carpet and felt a paper tearing sensation in his knee.     History: This is a 75 year old male presents with acute onset of left knee pain when he was kneeling on the carpet he got up he felt a paperlike tearing sensation over the anterior aspect of his knee associated with a burning sensation.  He now says that the knee just feels different he notes that individual stair climbing and stair descent will cause him some difficulty  He is on Eliquis  He denies any mechanical symptoms at this time  Physical Exam Constitutional:      General: He is not in acute distress.    Appearance: He is well-developed.     Comments: Well developed, well nourished Normal grooming and hygiene     Cardiovascular:     Comments: No peripheral edema Musculoskeletal:     Comments: Right knee examination shows a normally aligned knee with normal skin there is no tenderness there is normal range of motion there is no instability and muscle tone is normal  Left knee normal alignment skin is normal tenderness in the peripatellar region nontender over the patellar tendon nontender medial lateral joint line quadriceps contraction test positive mild crepitance on patellofemoral range of motion normal strength and ligaments are stable  Skin:    General: Skin is warm and dry.  Neurological:     Mental Status: He is alert and oriented to person, place, and time.     Sensory: No sensory deficit.     Coordination: Coordination normal.     Gait: Gait normal.     Deep Tendon Reflexes: Reflexes are normal and symmetric.  Psychiatric:        Mood and Affect: Mood normal.        Behavior: Behavior normal.        Thought Content: Thought content normal.        Judgment: Judgment normal.     Comments: Affect normal    Encounter Diagnosis  Name Primary?   Acute pain of left knee Yes   I think his acute knee pain may  have been straining of his patellar tendon but he seems to be more symptomatic with retropatellar symptoms consistent with chondromalacia and arthritis of the patellofemoral joint  His x-ray did not show any major degenerative changes or malalignment  Recommend quadriceps exercises and return in 4 weeks if no improvement then we can do an MRI  Reminder he is on Eliquis cannot take anti-inflammatories and he is taking Tylenol I recommended 500 mg every 6 as needed.

## 2020-10-04 DIAGNOSIS — I10 Essential (primary) hypertension: Secondary | ICD-10-CM | POA: Diagnosis not present

## 2020-10-15 ENCOUNTER — Ambulatory Visit: Payer: PPO | Admitting: Orthopedic Surgery

## 2020-10-26 DIAGNOSIS — I10 Essential (primary) hypertension: Secondary | ICD-10-CM | POA: Diagnosis not present

## 2020-10-31 DIAGNOSIS — I63411 Cerebral infarction due to embolism of right middle cerebral artery: Secondary | ICD-10-CM | POA: Diagnosis not present

## 2020-10-31 DIAGNOSIS — Z299 Encounter for prophylactic measures, unspecified: Secondary | ICD-10-CM | POA: Diagnosis not present

## 2020-10-31 DIAGNOSIS — I482 Chronic atrial fibrillation, unspecified: Secondary | ICD-10-CM | POA: Diagnosis not present

## 2020-10-31 DIAGNOSIS — I10 Essential (primary) hypertension: Secondary | ICD-10-CM | POA: Diagnosis not present

## 2020-10-31 DIAGNOSIS — I4891 Unspecified atrial fibrillation: Secondary | ICD-10-CM | POA: Diagnosis not present

## 2020-12-05 DIAGNOSIS — I10 Essential (primary) hypertension: Secondary | ICD-10-CM | POA: Diagnosis not present

## 2020-12-08 DIAGNOSIS — Z8673 Personal history of transient ischemic attack (TIA), and cerebral infarction without residual deficits: Secondary | ICD-10-CM | POA: Diagnosis not present

## 2020-12-08 DIAGNOSIS — I10 Essential (primary) hypertension: Secondary | ICD-10-CM | POA: Diagnosis not present

## 2020-12-08 DIAGNOSIS — E869 Volume depletion, unspecified: Secondary | ICD-10-CM | POA: Diagnosis not present

## 2020-12-08 DIAGNOSIS — R059 Cough, unspecified: Secondary | ICD-10-CM | POA: Diagnosis not present

## 2020-12-08 DIAGNOSIS — I4891 Unspecified atrial fibrillation: Secondary | ICD-10-CM | POA: Diagnosis not present

## 2020-12-08 DIAGNOSIS — J029 Acute pharyngitis, unspecified: Secondary | ICD-10-CM | POA: Diagnosis not present

## 2020-12-08 DIAGNOSIS — G4733 Obstructive sleep apnea (adult) (pediatric): Secondary | ICD-10-CM | POA: Diagnosis not present

## 2020-12-08 DIAGNOSIS — E079 Disorder of thyroid, unspecified: Secondary | ICD-10-CM | POA: Diagnosis not present

## 2020-12-08 DIAGNOSIS — R9431 Abnormal electrocardiogram [ECG] [EKG]: Secondary | ICD-10-CM | POA: Diagnosis not present

## 2020-12-08 DIAGNOSIS — U071 COVID-19: Secondary | ICD-10-CM | POA: Diagnosis not present

## 2020-12-08 DIAGNOSIS — Z7901 Long term (current) use of anticoagulants: Secondary | ICD-10-CM | POA: Diagnosis not present

## 2020-12-08 DIAGNOSIS — E785 Hyperlipidemia, unspecified: Secondary | ICD-10-CM | POA: Diagnosis not present

## 2020-12-08 DIAGNOSIS — I700 Atherosclerosis of aorta: Secondary | ICD-10-CM | POA: Diagnosis not present

## 2020-12-09 ENCOUNTER — Encounter: Payer: Self-pay | Admitting: Family Medicine

## 2020-12-09 DIAGNOSIS — Z79899 Other long term (current) drug therapy: Secondary | ICD-10-CM | POA: Diagnosis not present

## 2020-12-09 DIAGNOSIS — I10 Essential (primary) hypertension: Secondary | ICD-10-CM | POA: Diagnosis not present

## 2020-12-09 DIAGNOSIS — Z7901 Long term (current) use of anticoagulants: Secondary | ICD-10-CM | POA: Diagnosis not present

## 2020-12-09 DIAGNOSIS — Z8673 Personal history of transient ischemic attack (TIA), and cerebral infarction without residual deficits: Secondary | ICD-10-CM | POA: Diagnosis not present

## 2020-12-09 DIAGNOSIS — U071 COVID-19: Secondary | ICD-10-CM | POA: Diagnosis not present

## 2020-12-09 DIAGNOSIS — E079 Disorder of thyroid, unspecified: Secondary | ICD-10-CM | POA: Diagnosis not present

## 2020-12-09 DIAGNOSIS — I4891 Unspecified atrial fibrillation: Secondary | ICD-10-CM | POA: Diagnosis not present

## 2020-12-09 DIAGNOSIS — E785 Hyperlipidemia, unspecified: Secondary | ICD-10-CM | POA: Diagnosis not present

## 2020-12-11 DIAGNOSIS — U071 COVID-19: Secondary | ICD-10-CM | POA: Diagnosis not present

## 2020-12-11 DIAGNOSIS — Z299 Encounter for prophylactic measures, unspecified: Secondary | ICD-10-CM | POA: Diagnosis not present

## 2020-12-11 DIAGNOSIS — I639 Cerebral infarction, unspecified: Secondary | ICD-10-CM | POA: Diagnosis not present

## 2020-12-12 ENCOUNTER — Telehealth: Payer: Self-pay | Admitting: Family Medicine

## 2020-12-12 NOTE — Telephone Encounter (Signed)
LVM returning pt call. 

## 2020-12-12 NOTE — Telephone Encounter (Signed)
Pt is asking for a call to discuss him not using his CPAP since he has been diagnosed with Covid-19

## 2020-12-13 NOTE — Telephone Encounter (Signed)
Called and spoke with patient. He was exposed 12/05/20 when he picked up friend from airport who had been on a cruise. He became symptomatic 12/06/20. Went to ER twice. He is coughing still. Stopped using bipap machine d/t this momentarily. He plans to restart asap once he is feeling better.  He just wanted to update our office. Advised I will send FYI to AL,NP.

## 2020-12-17 DIAGNOSIS — E065 Other chronic thyroiditis: Secondary | ICD-10-CM | POA: Diagnosis not present

## 2020-12-17 DIAGNOSIS — E063 Autoimmune thyroiditis: Secondary | ICD-10-CM | POA: Diagnosis not present

## 2020-12-17 DIAGNOSIS — E038 Other specified hypothyroidism: Secondary | ICD-10-CM | POA: Diagnosis not present

## 2020-12-17 LAB — TSH: TSH: 1.18 (ref <=5.90)

## 2020-12-27 ENCOUNTER — Other Ambulatory Visit: Payer: Self-pay

## 2020-12-27 ENCOUNTER — Encounter: Payer: Self-pay | Admitting: "Endocrinology

## 2020-12-27 ENCOUNTER — Ambulatory Visit: Payer: PPO | Admitting: "Endocrinology

## 2020-12-27 VITALS — BP 120/64 | HR 68 | Ht 70.0 in | Wt 186.2 lb

## 2020-12-27 DIAGNOSIS — E063 Autoimmune thyroiditis: Secondary | ICD-10-CM

## 2020-12-27 DIAGNOSIS — E038 Other specified hypothyroidism: Secondary | ICD-10-CM

## 2020-12-27 MED ORDER — LEVOTHYROXINE SODIUM 50 MCG PO TABS: 50.0000 ug | ORAL_TABLET | Freq: Every day | ORAL | 1 refills | Status: DC

## 2020-12-27 NOTE — Progress Notes (Signed)
12/27/2020    Endocrinology follow-up note   Subjective:    Patient ID: Douglas Lee, male    DOB: 02/03/1946, PCP Glenda Chroman, MD.   Past Medical History:  Diagnosis Date   Atrial fibrillation ([place])    BiPAP (biphasic positive airway pressure) dependence    Essential hypertension    History of stroke 52/8413   Embolic, right parietal lobe and insular region   Hyperlipemia     Past Surgical History:  Procedure Laterality Date   COLONOSCOPY     KNEE ARTHROTOMY Right    MINOR HEMORRHOIDECTOMY  07/12/2015    Social History   Socioeconomic History   Marital status: Married    Spouse name: Not on file   Number of children: Not on file   Years of education: Not on file   Highest education level: Not on file  Occupational History   Not on file  Tobacco Use   Smoking status: Never   Smokeless tobacco: Never  Vaping Use   Vaping Use: Never used  Substance and Sexual Activity   Alcohol use: No    Alcohol/week: 0.0 standard drinks   Drug use: No   Sexual activity: Not on file  Other Topics Concern   Not on file  Social History Narrative   Not on file   Social Determinants of Health   Financial Resource Strain: Not on file  Food Insecurity: Not on file  Transportation Needs: Not on file  Physical Activity: Not on file  Stress: Not on file  Social Connections: Not on file    Family History  Problem Relation Age of Onset   Heart attack Father 35   Stroke Mother 78   Hypertension Other    Arrhythmia Brother        PPM    Outpatient Encounter Medications as of 12/27/2020  Medication Sig   Ascorbic Acid (VITAMIN C PO) Take 1 tablet by mouth daily.   VITAMIN D PO Take 1 tablet by mouth daily.   ZINC OXIDE PO Take 1 tablet by mouth daily in the afternoon.   apixaban (ELIQUIS) 5 MG TABS tablet Take 1 tablet (5 mg total) by mouth 2 (two) times daily.   Coenzyme Q10 (CO Q-10 PO) Take 1 tablet by mouth daily.   Garlic 2440 MG CAPS Take 1,000 mg by  mouth daily.   L-LYSINE PO Take 1 tablet by mouth daily.   levothyroxine (SYNTHROID) 50 MCG tablet Take 1 tablet (50 mcg total) by mouth daily before breakfast.   metoprolol tartrate (LOPRESSOR) 100 MG tablet Take 1 tablet (100 mg total) by mouth 2 (two) times daily.   Multiple Vitamin (MULTIVITAMIN) tablet Take 1 tablet by mouth daily.   Omega-3 Fatty Acids (FISH OIL) 1000 MG CAPS Take 1 capsule by mouth 3 (three) times daily.    rosuvastatin (CRESTOR) 10 MG tablet TAKE 1 TABLET BY MOUTH ONCE DAILY   Vitamin A 2400 MCG (8000 UT) CAPS Take 1 capsule by mouth daily.   [DISCONTINUED] levothyroxine (SYNTHROID) 50 MCG tablet Take 1 tablet (50 mcg total) by mouth daily before breakfast.   No facility-administered encounter medications on file as of 12/27/2020.    ALLERGIES: Allergies  Allergen Reactions   Elemental Sulfur    Lipitor [Atorvastatin] Other (See Comments)   Penicillins     VACCINATION STATUS: Immunization History  Administered Date(s) Administered   Influenza-Unspecified 01/07/2020   Moderna Sars-Covid-2 Vaccination 05/21/2019, 06/18/2019, 02/10/2020     HPI  [place]  Douglas Lee is 75 y.o. male who returns for follow-up in the management of hypothyroidism with repeat thyroid function tests.   PMD: Glenda Chroman, MD.  During his prior visits he had subacute thyroiditis which resolved into partial hypothyroidism.  He was initiated on low-dose levothyroxine, 50 mcg p.o. daily before breakfast.  He continues to benefit from this intervention.  His previsit labs are consistent with appropriate replacement.  He has no new acute complaints today.  He has recovered from COVID-19 infection in August 2022.   Prior to his last visit, thyroid uptake and scan was consistent with  subacute thyroiditis.   he denies dysphagia, choking, shortness of breath, no recent voice change.    he denies family history of thyroid dysfunction nor any family history of thyroid cancer.  he denies  personal history of goiter. he is not on any anti-thyroid medications nor on any thyroid hormone supplements.                          Review of systems Limited as above.   Objective:    BP 120/64   Pulse 68   Ht 5\' 10"  (1.778 m)   Wt 186 lb 3.2 oz (84.5 kg)   BMI 26.72 kg/m   Wt Readings from Last 3 Encounters:  12/27/20 186 lb 3.2 oz (84.5 kg)  09/17/20 192 lb (87.1 kg)  08/13/20 194 lb (88 kg)                                                Physical exam    CMP     Component Value Date/Time   NA 140 11/30/2018 1441   K 4.5 11/30/2018 1441   CL 105 11/30/2018 1441   CO2 25 11/30/2018 1441   GLUCOSE 95 11/30/2018 1441   BUN 12 02/10/2020 0000   CREATININE 1.0 02/10/2020 0000   CREATININE 1.23 (H) 11/30/2018 1441   CALCIUM 9.9 11/30/2018 1441   GFRNONAA 70 02/10/2020 0000   GFRAA 81 02/10/2020 0000     CBC    Component Value Date/Time   WBC 11.2 (H) 11/30/2018 1441   RBC 4.28 11/30/2018 1441   HGB 13.8 11/30/2018 1441   HCT 41.5 11/30/2018 1441   PLT 305 11/30/2018 1441   MCV 97.0 11/30/2018 1441   MCH 32.2 11/30/2018 1441   MCHC 33.3 11/30/2018 1441   RDW 12.2 11/30/2018 1441      Lipid Panel  No results found for: CHOL, TRIG, HDL, CHOLHDL, VLDL, LDLCALC, LDLDIRECT, LABVLDL   Lab Results  Component Value Date   TSH 1.18 12/17/2020   TSH 2.62 06/18/2020   TSH 7.29 (A) 05/02/2020   TSH 0.02 (A) 02/02/2020    February 13, 2020 lab work: Free T4 2.05-elevated, TSH 0.019-suppressed, total T4 14.5-elevated Labs on February 06, 2020: TSH 0.027 suppressed   Assessment & Plan:   1.  Hypothyroidism due to Hashimoto's thyroiditis  His presentation is consistent with appropriate replacement.  He is advised to continue levothyroxine 50 mcg p.o. daily before breakfast.     - We discussed about the correct intake of his thyroid hormone, on empty stomach at fasting, with water, separated by at least 30 minutes from breakfast and other medications,  and  separated by more than 4 hours from calcium, iron, multivitamins, acid reflux medications (  PPIs). -Patient is made aware of the fact that thyroid hormone replacement is needed for life, dose to be adjusted by periodic monitoring of thyroid function tests.   He is already on beta-blocker related to his atrial fibrillation.  He is rate controlled at 60-89.  His blood pressure is controlled.    -Patient is advised to maintain close follow up with Glenda Chroman, MD for primary care needs.   I spent 25 minutes in the care of the patient today including review of labs from Thyroid Function, CMP, and other relevant labs ; imaging/biopsy records (current and previous including abstractions from other facilities); face-to-face time discussing  his lab results and symptoms, medications doses, his options of short and long term treatment based on the latest standards of care / guidelines;   and documenting the encounter.  [name]  participated in the discussions, expressed understanding, and voiced agreement with the above plans.  All questions were answered to his satisfaction. he is encouraged to contact clinic should he have any questions or concerns prior to his return visit.    Follow up plan: Return in about 6 months (around 06/26/2021) for F/U with Pre-visit Labs.   Thank you for involving me in the care of this pleasant patient, and I will continue to update you with his progress.  Glade Lloyd, MD [facility] [place] [facility] Phone: [phone]  Fax: [phone]   12/27/2020, 12:32 PM  This note was partially dictated with voice recognition software. Similar sounding words can be transcribed inadequately or may not  be corrected upon review.

## 2021-01-04 DIAGNOSIS — I10 Essential (primary) hypertension: Secondary | ICD-10-CM | POA: Diagnosis not present

## 2021-02-04 DIAGNOSIS — I10 Essential (primary) hypertension: Secondary | ICD-10-CM | POA: Diagnosis not present

## 2021-02-13 DIAGNOSIS — Z299 Encounter for prophylactic measures, unspecified: Secondary | ICD-10-CM | POA: Diagnosis not present

## 2021-02-13 DIAGNOSIS — I10 Essential (primary) hypertension: Secondary | ICD-10-CM | POA: Diagnosis not present

## 2021-02-13 DIAGNOSIS — Z1331 Encounter for screening for depression: Secondary | ICD-10-CM | POA: Diagnosis not present

## 2021-02-13 DIAGNOSIS — Z1339 Encounter for screening examination for other mental health and behavioral disorders: Secondary | ICD-10-CM | POA: Diagnosis not present

## 2021-02-13 DIAGNOSIS — Z0000 Encounter for general adult medical examination without abnormal findings: Secondary | ICD-10-CM | POA: Diagnosis not present

## 2021-02-13 DIAGNOSIS — Z6827 Body mass index (BMI) 27.0-27.9, adult: Secondary | ICD-10-CM | POA: Diagnosis not present

## 2021-02-13 DIAGNOSIS — Z79899 Other long term (current) drug therapy: Secondary | ICD-10-CM | POA: Diagnosis not present

## 2021-02-13 DIAGNOSIS — R5383 Other fatigue: Secondary | ICD-10-CM | POA: Diagnosis not present

## 2021-02-13 DIAGNOSIS — E7800 Pure hypercholesterolemia, unspecified: Secondary | ICD-10-CM | POA: Diagnosis not present

## 2021-02-13 DIAGNOSIS — Z2821 Immunization not carried out because of patient refusal: Secondary | ICD-10-CM | POA: Diagnosis not present

## 2021-02-13 DIAGNOSIS — Z7189 Other specified counseling: Secondary | ICD-10-CM | POA: Diagnosis not present

## 2021-02-13 DIAGNOSIS — Z125 Encounter for screening for malignant neoplasm of prostate: Secondary | ICD-10-CM | POA: Diagnosis not present

## 2021-02-22 ENCOUNTER — Encounter: Payer: Self-pay | Admitting: *Deleted

## 2021-02-22 ENCOUNTER — Encounter: Payer: Self-pay | Admitting: Family Medicine

## 2021-02-22 ENCOUNTER — Ambulatory Visit: Payer: PPO | Admitting: Family Medicine

## 2021-02-22 VITALS — BP 118/80 | HR 72 | Ht 70.0 in | Wt 192.4 lb

## 2021-02-22 DIAGNOSIS — E782 Mixed hyperlipidemia: Secondary | ICD-10-CM | POA: Diagnosis not present

## 2021-02-22 DIAGNOSIS — I10 Essential (primary) hypertension: Secondary | ICD-10-CM

## 2021-02-22 DIAGNOSIS — I480 Paroxysmal atrial fibrillation: Secondary | ICD-10-CM

## 2021-02-22 MED ORDER — APIXABAN 5 MG PO TABS: 5.0000 mg | ORAL_TABLET | Freq: Two times a day (BID) | ORAL | 0 refills | Status: DC

## 2021-02-22 NOTE — Progress Notes (Signed)
Cardiology Office Note  Date: 02/22/2021   ID: Douglas Lee, Douglas Lee 06-17-1945, MRN [id number]  PCP:  Glenda Chroman, MD  Cardiologist:  Rozann Lesches, MD Electrophysiologist:  None   Chief Complaint: 1 year follow-up  History of Present Illness: Douglas Lee is a 75 y.o. male with a history of atrial fibrillation, OSA, HTN, history of CVA, hyperlipidemia.  He was last seen by Dr. Domenic Polite on 03/16/2020.  His LDL had decreased to 86 on Crestor.  He continues to follow with [facility] internal medicine.  He was following Dr. Jodelle Green for management of hypothyroidism.  His EKG showed rate controlled atrial fibrillation.  CHA2DS2-VASc score was 5 and he continued on Eliquis for stroke prophylaxis.  There was no change in Lopressor dosage.  Doing well on Crestor.  He is here for 1 year follow-up today.  He denies any issues other than the fact that he had COVID around August 31 and lost his sense of taste and smell for about 2 months.  States taste and smell have returned.  He denies any anginal or exertional symptoms, orthostatic symptoms, CVA or TIA-like symptoms, palpitations or arrhythmias.  Denies any bleeding on Eliquis.  He is requesting some samples due to being in the donut hole.  Blood pressure is well controlled on current medications.  States he recently had lab work with PCP and everything was normal.    Past Medical History:  Diagnosis Date   Atrial fibrillation (HCC)    BiPAP (biphasic positive airway pressure) dependence    Essential hypertension    History of stroke 66/0600   Embolic, right parietal lobe and insular region   Hyperlipemia     Past Surgical History:  Procedure Laterality Date   COLONOSCOPY     KNEE ARTHROTOMY Right    MINOR HEMORRHOIDECTOMY  07/12/2015    Current Outpatient Medications  Medication Sig Dispense Refill   amLODipine-benazepril (LOTREL) 5-20 MG capsule Take 1 capsule by mouth daily.     Ascorbic Acid (VITAMIN C PO) Take 1,000 mg by mouth  daily.     Coenzyme Q10 (CO Q-10 PO) Take 200 mg by mouth daily.     cyanocobalamin 1000 MCG tablet Take 1,000 mcg by mouth daily.     Garlic 4599 MG CAPS Take 1,000 mg by mouth daily.     L-LYSINE PO Take 500 mg by mouth daily.     levothyroxine (SYNTHROID) 50 MCG tablet Take 1 tablet (50 mcg total) by mouth daily before breakfast. 95 tablet 1   metoprolol tartrate (LOPRESSOR) 100 MG tablet Take 1 tablet (100 mg total) by mouth 2 (two) times daily. 180 tablet 3   Multiple Vitamin (MULTIVITAMIN) tablet Take 1 tablet by mouth daily.     Omega-3 Fatty Acids (FISH OIL) 1000 MG CAPS Take 1 capsule by mouth 3 (three) times daily.      rosuvastatin (CRESTOR) 10 MG tablet TAKE 1 TABLET BY MOUTH ONCE DAILY 90 tablet 3   Vitamin A 2400 MCG (8000 UT) CAPS Take 1 capsule by mouth daily.     VITAMIN D PO Take 50 mcg by mouth daily.     ZINC OXIDE PO Take 50 mg by mouth daily in the afternoon.     apixaban (ELIQUIS) 5 MG TABS tablet Take 1 tablet (5 mg total) by mouth 2 (two) times daily. 56 tablet 0   No current facility-administered medications for this visit.   Allergies:  Elemental sulfur, Lipitor [atorvastatin], and Penicillins   Social  History: The patient  reports that he has never smoked. He has never used smokeless tobacco. He reports that he does not drink alcohol and does not use drugs.   Family History: The patient's family history includes Arrhythmia in his brother; Heart attack (age of onset: 56) in his father; Hypertension in an other family member; Stroke (age of onset: 36) in his mother.   ROS:  Please see the history of present illness. Otherwise, complete review of systems is positive for none.  All other systems are reviewed and negative.   Physical Exam: VS:  BP 118/80   Pulse 72   Ht 5\' 10"  (1.778 m)   Wt 192 lb 6.4 oz (87.3 kg)   SpO2 97%   BMI 27.61 kg/m , BMI Body mass index is 27.61 kg/m.  Wt Readings from Last 3 Encounters:  02/22/21 192 lb 6.4 oz (87.3 kg)   12/27/20 186 lb 3.2 oz (84.5 kg)  09/17/20 192 lb (87.1 kg)    General: Patient appears comfortable at rest. Neck: Supple, no elevated JVP or carotid bruits, no thyromegaly. Lungs: Clear to auscultation, nonlabored breathing at rest. Cardiac: Regular rate and rhythm, no S3 or significant systolic murmur, no pericardial rub. Extremities: No pitting edema, distal pulses 2+. Skin: Warm and dry. Musculoskeletal: No kyphosis. Neuropsychiatric: Alert and oriented x3, affect grossly appropriate.  ECG:    Recent Labwork: 12/17/2020: TSH 1.18  No results found for: CHOL, TRIG, HDL, CHOLHDL, VLDL, LDLCALC, LDLDIRECT  Other Studies Reviewed Today:  Echocardiogram 10/28/2017 [facility]): LVEF 60 to 65%, severe left atrial enlargement, moderate right atrial enlargement, sclerotic aortic valve with trivial aortic regurgitation, mild mitral regurgitation, mild tricuspid regurgitation, no pericardial effusion.    Assessment and Plan:  1. Paroxysmal atrial fibrillation (HCC)   2. Mixed hyperlipidemia   3. Essential hypertension    1. Paroxysmal atrial fibrillation (HCC) Heart rate is regular today at 72.  Denies any bleeding on Eliquis.  Continue Eliquis 5 mg p.o. twice daily.  Continue metoprolol 100 mg p.o. twice daily.  Patient is requesting samples of Eliquis due to being in the donut hole.  2. Mixed hyperlipidemia Continue Crestor 10 mg p.o. daily.  3.  Essential hypertension Blood pressure well controlled today.  Continue amlodipine/benazepril 5/20 mg p.o. daily.  Continue metoprolol 100 mg p.o. twice daily.  Medication Adjustments/Labs and Tests Ordered: Current medicines are reviewed at length with the patient today.  Concerns regarding medicines are outlined above.   Disposition: Follow-up with Dr. Domenic Polite or APP 1 year  Signed, Levell July, NP 02/22/2021 2:58 PM    [facility] [place], [place], [place] [address] Phone: [phone]; Fax: [phone]

## 2021-02-22 NOTE — Patient Instructions (Addendum)

## 2021-03-06 DIAGNOSIS — I10 Essential (primary) hypertension: Secondary | ICD-10-CM | POA: Diagnosis not present

## 2021-03-26 ENCOUNTER — Ambulatory Visit: Payer: PPO | Admitting: Cardiology

## 2021-03-26 DIAGNOSIS — L259 Unspecified contact dermatitis, unspecified cause: Secondary | ICD-10-CM | POA: Diagnosis not present

## 2021-03-26 DIAGNOSIS — L308 Other specified dermatitis: Secondary | ICD-10-CM | POA: Diagnosis not present

## 2021-04-05 DIAGNOSIS — I10 Essential (primary) hypertension: Secondary | ICD-10-CM | POA: Diagnosis not present

## 2021-05-05 DIAGNOSIS — I10 Essential (primary) hypertension: Secondary | ICD-10-CM | POA: Diagnosis not present

## 2021-05-22 DIAGNOSIS — I4891 Unspecified atrial fibrillation: Secondary | ICD-10-CM | POA: Diagnosis not present

## 2021-05-22 DIAGNOSIS — I10 Essential (primary) hypertension: Secondary | ICD-10-CM | POA: Diagnosis not present

## 2021-05-22 DIAGNOSIS — Z299 Encounter for prophylactic measures, unspecified: Secondary | ICD-10-CM | POA: Diagnosis not present

## 2021-05-22 DIAGNOSIS — Z789 Other specified health status: Secondary | ICD-10-CM | POA: Diagnosis not present

## 2021-06-04 DIAGNOSIS — I10 Essential (primary) hypertension: Secondary | ICD-10-CM | POA: Diagnosis not present

## 2021-06-19 DIAGNOSIS — E063 Autoimmune thyroiditis: Secondary | ICD-10-CM | POA: Diagnosis not present

## 2021-06-19 LAB — TSH: TSH: 3.22 (ref 0.41–5.90)

## 2021-06-26 ENCOUNTER — Encounter: Payer: Self-pay | Admitting: "Endocrinology

## 2021-06-26 ENCOUNTER — Ambulatory Visit: Payer: PPO | Admitting: "Endocrinology

## 2021-06-26 ENCOUNTER — Other Ambulatory Visit: Payer: Self-pay

## 2021-06-26 VITALS — BP 122/76 | HR 60 | Ht 70.0 in | Wt 198.8 lb

## 2021-06-26 DIAGNOSIS — E038 Other specified hypothyroidism: Secondary | ICD-10-CM | POA: Diagnosis not present

## 2021-06-26 DIAGNOSIS — E063 Autoimmune thyroiditis: Secondary | ICD-10-CM

## 2021-06-26 MED ORDER — LEVOTHYROXINE SODIUM 75 MCG PO TABS: 75.0000 ug | ORAL_TABLET | Freq: Every day | ORAL | 1 refills | Status: DC

## 2021-06-26 NOTE — Progress Notes (Signed)
? ? ? 06/26/2021   ? ?Endocrinology follow-up note ? ? ?Subjective:  ? ? Patient ID: Douglas Lee, male    DOB: March 09, 1946, PCP Glenda Chroman, MD. ? ? ?Past Medical History:  ?Diagnosis Date  ? Atrial fibrillation ([place])   ? BiPAP (biphasic positive airway pressure) dependence   ? Essential hypertension   ? History of stroke 10/2017  ? Embolic, right parietal lobe and insular region  ? Hyperlipemia   ? ? ?Past Surgical History:  ?Procedure Laterality Date  ? COLONOSCOPY    ? KNEE ARTHROTOMY Right   ? MINOR HEMORRHOIDECTOMY  07/12/2015  ? ? ?Social History  ? ?Socioeconomic History  ? Marital status: Married  ?  Spouse name: Not on file  ? Number of children: Not on file  ? Years of education: Not on file  ? Highest education level: Not on file  ?Occupational History  ? Not on file  ?Tobacco Use  ? Smoking status: Never  ? Smokeless tobacco: Never  ?Vaping Use  ? Vaping Use: Never used  ?Substance and Sexual Activity  ? Alcohol use: No  ?  Alcohol/week: 0.0 standard drinks  ? Drug use: No  ? Sexual activity: Not on file  ?Other Topics Concern  ? Not on file  ?Social History Narrative  ? Not on file  ? ?Social Determinants of Health  ? ?Financial Resource Strain: Not on file  ?Food Insecurity: Not on file  ?Transportation Needs: Not on file  ?Physical Activity: Not on file  ?Stress: Not on file  ?Social Connections: Not on file  ? ? ?Family History  ?Problem Relation Age of Onset  ? Heart attack Father 41  ? Stroke Mother 50  ? Hypertension Other   ? Arrhythmia Brother   ?     PPM  ? ? ?Outpatient Encounter Medications as of 06/26/2021  ?Medication Sig  ? amLODipine-benazepril (LOTREL) 5-20 MG capsule Take 1 capsule by mouth daily.  ? apixaban (ELIQUIS) 5 MG TABS tablet Take 1 tablet (5 mg total) by mouth 2 (two) times daily.  ? Ascorbic Acid (VITAMIN C PO) Take 1,000 mg by mouth daily.  ? Coenzyme Q10 (CO Q-10 PO) Take 200 mg by mouth daily.  ? cyanocobalamin 1000 MCG tablet Take 1,000 mcg by mouth daily.  ?  Garlic 9977 MG CAPS Take 1,000 mg by mouth daily.  ? L-LYSINE PO Take 500 mg by mouth daily.  ? levothyroxine (SYNTHROID) 75 MCG tablet Take 1 tablet (75 mcg total) by mouth daily before breakfast.  ? metoprolol tartrate (LOPRESSOR) 100 MG tablet Take 1 tablet (100 mg total) by mouth 2 (two) times daily.  ? Multiple Vitamin (MULTIVITAMIN) tablet Take 1 tablet by mouth daily.  ? Omega-3 Fatty Acids (FISH OIL) 1000 MG CAPS Take 1 capsule by mouth 3 (three) times daily.   ? rosuvastatin (CRESTOR) 10 MG tablet TAKE 1 TABLET BY MOUTH ONCE DAILY  ? Vitamin A 2400 MCG (8000 UT) CAPS Take 1 capsule by mouth daily.  ? VITAMIN D PO Take 50 mcg by mouth daily.  ? ZINC OXIDE PO Take 50 mg by mouth daily in the afternoon.  ? [DISCONTINUED] levothyroxine (SYNTHROID) 50 MCG tablet Take 1 tablet (50 mcg total) by mouth daily before breakfast.  ? ?No facility-administered encounter medications on file as of 06/26/2021.  ? ? ?ALLERGIES: ?Allergies  ?Allergen Reactions  ? Elemental Sulfur   ? Lipitor [Atorvastatin] Other (See Comments)  ? Penicillins   ? ? ?VACCINATION STATUS: ?  Immunization History  ?Administered Date(s) Administered  ? Influenza-Unspecified 01/07/2020  ? Moderna Sars-Covid-2 Vaccination 05/21/2019, 06/18/2019, 02/10/2020  ? ? ? ?HPI ? ?Douglas Lee is 76 y.o. male who returns for follow-up in the management of hypothyroidism with repeat thyroid function tests.  ? ?PMD: Glenda Chroman, MD.  ?During his prior visits he had subacute thyroiditis which resolved into partial hypothyroidism.  He was initiated on levothyroxine, currently 50 mcg p.o. daily.  He reports consistency and compliance.   He continues to benefit from this intervention.  He complains of cold intolerance.  ? ?Prior to his last visit, thyroid uptake and scan was consistent with  subacute thyroiditis.  ? ?he denies dysphagia, choking, shortness of breath, no recent voice change.  ?  ?he denies family history of thyroid dysfunction nor any family  history of thyroid cancer.  he denies personal history of goiter. he is not on any anti-thyroid medications nor on any thyroid hormone supplements. ? ?                        Review of systems ?Limited as above. ? ? ?Objective:  ?  ?BP 122/76   Pulse 60   Ht '5\' 10"'$  (1.778 m)   Wt 198 lb 12.8 oz (90.2 kg)   BMI 28.52 kg/m?   ?Wt Readings from Last 3 Encounters:  ?06/26/21 198 lb 12.8 oz (90.2 kg)  ?02/22/21 192 lb 6.4 oz (87.3 kg)  ?12/27/20 186 lb 3.2 oz (84.5 kg)  ?  ?                     ?                       Physical exam ? ? ? ?CMP  ?   ?Component Value Date/Time  ? NA 140 11/30/2018 1441  ? K 4.5 11/30/2018 1441  ? CL 105 11/30/2018 1441  ? CO2 25 11/30/2018 1441  ? GLUCOSE 95 11/30/2018 1441  ? BUN 12 02/10/2020 0000  ? CREATININE 1.0 02/10/2020 0000  ? CREATININE 1.23 (H) 11/30/2018 1441  ? CALCIUM 9.9 11/30/2018 1441  ? GFRNONAA 70 02/10/2020 0000  ? GFRAA 81 02/10/2020 0000  ? ? ? ?CBC ?   ?Component Value Date/Time  ? WBC 11.2 (H) 11/30/2018 1441  ? RBC 4.28 11/30/2018 1441  ? HGB 13.8 11/30/2018 1441  ? HCT 41.5 11/30/2018 1441  ? PLT 305 11/30/2018 1441  ? MCV 97.0 11/30/2018 1441  ? MCH 32.2 11/30/2018 1441  ? MCHC 33.3 11/30/2018 1441  ? RDW 12.2 11/30/2018 1441  ? ? ? ? ?Lipid Panel  ?No results found for: CHOL, TRIG, HDL, CHOLHDL, VLDL, LDLCALC, LDLDIRECT, LABVLDL ? ? ?Lab Results  ?Component Value Date  ? TSH 3.22 06/19/2021  ? TSH 1.18 12/17/2020  ? TSH 2.62 06/18/2020  ? TSH 7.29 (A) 05/02/2020  ? TSH 0.02 (A) 02/02/2020  ?  ?February 13, 2020 lab work: ?Free T4 2.05-elevated, TSH 0.019-suppressed, total T4 14.5-elevated ?Labs on February 06, 2020: TSH 0.027 suppressed  ? ?Assessment & Plan:  ? ?1.  Hypothyroidism due to Hashimoto's thyroiditis  ?His previsit thyroid function tests are consistent with slight under replacement.  He would benefit from a higher dose of levothyroxine.  I discussed and increase his levothyroxine to 75 mcg p.o. daily for breakfast.   ? ? - We discussed about the  correct intake of his thyroid hormone,  on empty stomach at fasting, with water, separated by at least 30 minutes from breakfast and other medications,  and separated by more than 4 hours from calcium, iron, multivitamins, acid reflux medications (PPIs). ?-Patient is made aware of the fact that thyroid hormone replacement is needed for life, dose to be adjusted by periodic monitoring of thyroid function tests. ? ?He is already on beta-blocker related to his atrial fibrillation.  He is rate controlled at 60-89.  His blood pressure is controlled.   ? ?-Patient is advised to maintain close follow up with Glenda Chroman, MD for primary care needs. ? ? ?I spent 21 minutes in the care of the patient today including review of labs from Thyroid Function, CMP, and other relevant labs ; imaging/biopsy records (current and previous including abstractions from other facilities); face-to-face time discussing  his lab results and symptoms, medications doses, his options of short and long term treatment based on the latest standards of care / guidelines;   and documenting the encounter. ? ?[name]  participated in the discussions, expressed understanding, and voiced agreement with the above plans.  All questions were answered to his satisfaction. he is encouraged to contact clinic should he have any questions or concerns prior to his return visit. ? ? ? ?Follow up plan: ?Return in about 6 months (around 12/27/2021) for F/U with Pre-visit Labs. ? ? ?Thank you for involving me in the care of this pleasant patient, and I will continue to update you with his progress. ? ?Glade Lloyd, MD ?[facility] ?[facility] ?Phone: [phone]  Fax: [phone]  ? ?06/26/2021, 11:10 AM ? ?This note was partially dictated with voice recognition software. Similar sounding words can be transcribed inadequately or may not  be corrected upon review. ?

## 2021-07-01 ENCOUNTER — Other Ambulatory Visit: Payer: Self-pay | Admitting: *Deleted

## 2021-07-01 DIAGNOSIS — I480 Paroxysmal atrial fibrillation: Secondary | ICD-10-CM

## 2021-07-01 MED ORDER — APIXABAN 5 MG PO TABS: 5.0000 mg | ORAL_TABLET | Freq: Two times a day (BID) | ORAL | 5 refills | Status: DC

## 2021-07-01 NOTE — Telephone Encounter (Signed)
Prescription refill request for Eliquis received. ?Indication: PAF ?Last office visit: 02/22/21  Lawanda Cousins FNP ?Scr: 1.34 on 12/08/20 ?Age: 76 ?Weight: 87.3kg ? ?Based on above findings Eliquis '5mg'$  twice daily is the appropriate dose.  Refill approved. ? ?

## 2021-07-04 DIAGNOSIS — I10 Essential (primary) hypertension: Secondary | ICD-10-CM | POA: Diagnosis not present

## 2021-08-04 DIAGNOSIS — I10 Essential (primary) hypertension: Secondary | ICD-10-CM | POA: Diagnosis not present

## 2021-08-08 ENCOUNTER — Other Ambulatory Visit: Payer: Self-pay | Admitting: Cardiology

## 2021-08-15 NOTE — Patient Instructions (Addendum)
Please continue using your ASV regularly. While your insurance requires that you use ASV at least 4 hours each night on 70% of the nights, I recommend, that you not skip any nights and use it throughout the night if you can. Getting used to ASV and staying with the treatment long term does take time and patience and discipline. Untreated obstructive sleep apnea when it is moderate to severe can have an adverse impact on cardiovascular health and raise her risk for heart disease, arrhythmias, hypertension, congestive heart failure, stroke and diabetes. Untreated obstructive sleep apnea causes sleep disruption, nonrestorative sleep, and sleep deprivation. This can have an impact on your day to day functioning and cause daytime sleepiness and impairment of cognitive function, memory loss, mood disturbance, and problems focussing. Using ASV regularly can improve these symptoms. ? ?I would like for you to change you mask out every 30 days to help prevent leak. You can contact your DME with contact below.  ? ?DME: [facility] ?[phone]          ?[phone]  ? ?I will check compliance download in 6-8 weeks to make sure leak has improved and apneic events are better managed. We will discuss any changes needed at that time.  ? ?Follow up in 1 year assuming all is well with next download. May see you sooner if needed.  ?

## 2021-08-15 NOTE — Progress Notes (Signed)
? ? ?PATIENT: Douglas Lee ?DOB: 01-26-1946 ? ?REASON FOR VISIT: follow up ?HISTORY FROM: patient ? ?Chief Complaint  ?Patient presents with  ? Obstructive Sleep Apnea  ?  Rm 1, alone. Here for yearly CPAP f/u. Pt reports doing well. No concerns.   ?  ? ?HISTORY OF PRESENT ILLNESS: ? ?08/19/21 ALL:  ?Douglas Lee returns for follow up for CSA on ASV. He continues to do well on therapy. He is using ASV every night. He feels well rested when using ASV and reports sleeping "great". He has noticed more of a leak over the past few months. His cardiologist recommends he sleeps on his left side for atrial fib. He knows that his apnea was worse on his side with original testing (non supine AHI 8.7). He also reports not changing his mask. He is not sure he has ever changed out his mask.  ? ? ? ?08/13/2020 ALL:  ?Deep returns for folow up for central sleep apnea managed with ASV. He continues to do very well with therapy. He is using ASV nightly. He is sleeping well. He is using a FFM. No leaks.  ? ? ? ?08/10/2019 ALL:  ?Douglas Lee is a 76 y.o. male here today for follow up of central sleep apnea on ASV therapy.  He continues to note significant improvements in how he feels.  He wakes in the morning full of energy.  He is very happy using ASV therapy.  He denies any concerns with his machine or supplies. ? ?Compliance report dated 07/10/2019 through 08/08/2019 reveals that he used ASV therapy 30 of the past 30 days for compliance of 100%.  He used ASV greater than 4 hours all 30 days for compliance of 100%.  Average usage was 7 hours and 42 minutes.  Residual AHI is 3.5 with a EPAP of 5 cm of water minimum pressure support of 3 cm of water and maximum pressure support of 15 cm of water.  There was no leak noted. ? ? ?HISTORY: (copied from my note on 02/09/2019) ? ?Douglas Lee is a 76 y.o. male here today for follow up for central sleep apnea on ASV. He was previously treated with CPAP therapy but continued to have apneic  events and felt that he was not getting enough air with CPAP. Compliance report reviewed and showed central apnea >19/hr. He was switched to ASV following titration study. He reports that he is feeing fantastic. He notes a significant improvement in sleep quality and increased energy levels. He denies any concerns with ASV.  ?  ?Compliance report dated 01/08/2019 through 02/06/2019 reveals that he is using ASV nightly for compliance 100%.  He is using ASV greater than 4 hours every night for compliance of 100%.  Average usage was 7 hours and 54 minutes.  AHI is now 5.8 with EPAP of 5 cm of water, minimum pressure support of 3 cm of water and maximum pressure support of 15 cm of water.  There is no significant leak noted. ?  ?HISTORY: (copied from my note on 10/05/2018) ?  ?Douglas Lee is a 76 y.o. male here today for follow up for OSA on CPAP.  He reports that he has been using his CPAP machine every night.  He is frustrated as his mask does not fit well.  He continues to note a significant leak when using his mask.  He has reached out to [place] who was very helpful, however, they were unable to test the fit  due to the pandemic.  He has a mustache and feels that this is inhibiting a good seal.  He does report benefit from using the machine.  He feels that he is resting a little better.  AHI remains elevated and significant leak is noted on the following download report. ?  ?  ?09/05/2018 - 10/04/2018 ?  ?Compliance Report ?Usage 09/05/2018 - 10/04/2018 ?Usage days 29/30 days (97%) ?>= 4 hours 18 days (60%) ?< 4 hours 11 days (37%) ?Usage hours 131 hours 56 minutes ?Average usage (total days) 4 hours 24 minutes ?Average usage (days used) 4 hours 33 minutes ?Median usage (days used) 4 hours 53 minutes ?Total used hours (value since last reset - 10/04/2018) 142 hours ?AirSense 10 AutoSet ?Serial number [id number] ?Mode AutoSet ?Min Pressure 5 cmH2O ?Max Pressure 16 cmH2O ?EPR Fulltime ?EPR level  2 ?Response Standard ?Therapy ?Pressure - cmH2O Median: 6.3 95th percentile: 8.8 Maximum: 9.8 ?Leaks - L/min Median: 28.4 95th percentile: 49.6 Maximum: 61.1 ?Events per hour AI: 30.3 HI: 0.4 AHI: 30.7 ?Apnea Index Central: 7.9 Obstructive: 1.8 Unknown: 20.5 ?RERA Index 0.6 ?Cheyne-Stokes respiration (average duration per night) 1 hours 7 minutes (21%) ? ? ?REVIEW OF SYSTEMS: Out of a complete 14 system review of symptoms, the patient complains only of the following symptoms, none and all other reviewed systems are negative. ? ?ESS: 1 ? ? ?ALLERGIES: ?Allergies  ?Allergen Reactions  ? Elemental Sulfur   ? Lipitor [Atorvastatin] Other (See Comments)  ? Penicillins   ? ? ?HOME MEDICATIONS: ?Outpatient Medications Prior to Visit  ?Medication Sig Dispense Refill  ? amLODipine-benazepril (LOTREL) 5-20 MG capsule Take 1 capsule by mouth daily.    ? apixaban (ELIQUIS) 5 MG TABS tablet Take 1 tablet (5 mg total) by mouth 2 (two) times daily. 60 tablet 5  ? Ascorbic Acid (VITAMIN C PO) Take 1,000 mg by mouth daily.    ? Coenzyme Q10 (CO Q-10 PO) Take 200 mg by mouth daily.    ? cyanocobalamin 1000 MCG tablet Take 1,000 mcg by mouth daily.    ? Garlic 5465 MG CAPS Take 1,000 mg by mouth daily.    ? L-LYSINE PO Take 500 mg by mouth daily.    ? levothyroxine (SYNTHROID) 75 MCG tablet Take 1 tablet (75 mcg total) by mouth daily before breakfast. 90 tablet 1  ? metoprolol tartrate (LOPRESSOR) 100 MG tablet Take 1 tablet by mouth twice daily 180 tablet 2  ? Multiple Vitamin (MULTIVITAMIN) tablet Take 1 tablet by mouth daily.    ? Omega-3 Fatty Acids (FISH OIL) 1000 MG CAPS Take 1 capsule by mouth 3 (three) times daily.     ? rosuvastatin (CRESTOR) 10 MG tablet TAKE 1 TABLET BY MOUTH ONCE DAILY 90 tablet 3  ? Vitamin A 2400 MCG (8000 UT) CAPS Take 1 capsule by mouth daily.    ? VITAMIN D PO Take 50 mcg by mouth daily.    ? ZINC OXIDE PO Take 50 mg by mouth daily in the afternoon.    ? ?No facility-administered medications prior to  visit.  ? ? ?PAST MEDICAL HISTORY: ?Past Medical History:  ?Diagnosis Date  ? Atrial fibrillation ([place])   ? BiPAP (biphasic positive airway pressure) dependence   ? COVID-19   ? Essential hypertension   ? History of stroke 10/2017  ? Embolic, right parietal lobe and insular region  ? Hyperlipemia   ? ? ?PAST SURGICAL HISTORY: ?Past Surgical History:  ?Procedure Laterality Date  ? COLONOSCOPY    ?  KNEE ARTHROTOMY Right   ? MINOR HEMORRHOIDECTOMY  07/12/2015  ? ? ?FAMILY HISTORY: ?Family History  ?Problem Relation Age of Onset  ? Heart attack Father 66  ? Stroke Mother 101  ? Hypertension Other   ? Arrhythmia Brother   ?     PPM  ? ? ?SOCIAL HISTORY: ?Social History  ? ?Socioeconomic History  ? Marital status: Married  ?  Spouse name: Not on file  ? Number of children: Not on file  ? Years of education: Not on file  ? Highest education level: Not on file  ?Occupational History  ? Not on file  ?Tobacco Use  ? Smoking status: Never  ? Smokeless tobacco: Never  ?Vaping Use  ? Vaping Use: Never used  ?Substance and Sexual Activity  ? Alcohol use: No  ?  Alcohol/week: 0.0 standard drinks  ? Drug use: No  ? Sexual activity: Not on file  ?Other Topics Concern  ? Not on file  ?Social History Narrative  ? Not on file  ? ?Social Determinants of Health  ? ?Financial Resource Strain: Not on file  ?Food Insecurity: Not on file  ?Transportation Needs: Not on file  ?Physical Activity: Not on file  ?Stress: Not on file  ?Social Connections: Not on file  ?Intimate Partner Violence: Not on file  ? ? ? ? ?PHYSICAL EXAM ? ?Vitals:  ? 08/19/21 0739  ?BP: (!) 148/93  ?Pulse: 80  ?Weight: 192 lb (87.1 kg)  ?Height: '5\' 10"'$  (1.778 m)  ? ? ?Body mass index is 27.55 kg/m?. ? ?Generalized: Well developed, in no acute distress  ?Cardiology: normal rate and rhythm, no murmur noted ?Respiratory: clear to auscultation bilaterally  ?Neurological examination  ?Mentation: Alert oriented to time, place, history taking. Follows all commands speech and  language fluent ?Cranial nerve II-XII: Pupils were equal round reactive to light. Extraocular movements were full, visual field were full for the year ?Motor: The motor testing reveals 5 over 5 strength of all 4 extrem

## 2021-08-19 ENCOUNTER — Encounter: Payer: Self-pay | Admitting: Family Medicine

## 2021-08-19 ENCOUNTER — Ambulatory Visit: Payer: PPO | Admitting: Family Medicine

## 2021-08-19 VITALS — BP 148/93 | HR 80 | Ht 70.0 in | Wt 192.0 lb

## 2021-08-19 DIAGNOSIS — Z7189 Other specified counseling: Secondary | ICD-10-CM | POA: Diagnosis not present

## 2021-08-19 DIAGNOSIS — G4731 Primary central sleep apnea: Secondary | ICD-10-CM

## 2021-08-19 NOTE — Progress Notes (Addendum)
New order faxed to DME: [facility]   [phone]   Fax confirmation received.  

## 2021-08-24 ENCOUNTER — Other Ambulatory Visit: Payer: Self-pay | Admitting: Cardiology

## 2021-08-27 DIAGNOSIS — Z789 Other specified health status: Secondary | ICD-10-CM | POA: Diagnosis not present

## 2021-08-27 DIAGNOSIS — I10 Essential (primary) hypertension: Secondary | ICD-10-CM | POA: Diagnosis not present

## 2021-08-27 DIAGNOSIS — N1831 Chronic kidney disease, stage 3a: Secondary | ICD-10-CM | POA: Diagnosis not present

## 2021-08-27 DIAGNOSIS — I4891 Unspecified atrial fibrillation: Secondary | ICD-10-CM | POA: Diagnosis not present

## 2021-08-27 DIAGNOSIS — Z299 Encounter for prophylactic measures, unspecified: Secondary | ICD-10-CM | POA: Diagnosis not present

## 2021-09-03 DIAGNOSIS — I10 Essential (primary) hypertension: Secondary | ICD-10-CM | POA: Diagnosis not present

## 2021-09-05 DIAGNOSIS — H524 Presbyopia: Secondary | ICD-10-CM | POA: Diagnosis not present

## 2021-10-03 DIAGNOSIS — I10 Essential (primary) hypertension: Secondary | ICD-10-CM | POA: Diagnosis not present

## 2021-10-24 ENCOUNTER — Telehealth: Payer: Self-pay | Admitting: Family Medicine

## 2021-10-24 NOTE — Telephone Encounter (Signed)
Please let him know that his apneic events look much better!!! They were 8 per hour and now 5.3. His leak is also much better. Encourage him to keep doing what he is doing and we will see him as scheduled.

## 2021-10-24 NOTE — Telephone Encounter (Signed)
LVM for pt relaying message from AL,NP. Advised him to call back if he has any further questions.

## 2021-11-04 DIAGNOSIS — I10 Essential (primary) hypertension: Secondary | ICD-10-CM | POA: Diagnosis not present

## 2021-11-14 ENCOUNTER — Other Ambulatory Visit: Payer: Self-pay | Admitting: Cardiology

## 2021-11-27 DIAGNOSIS — I10 Essential (primary) hypertension: Secondary | ICD-10-CM | POA: Diagnosis not present

## 2021-11-27 DIAGNOSIS — Z789 Other specified health status: Secondary | ICD-10-CM | POA: Diagnosis not present

## 2021-11-27 DIAGNOSIS — N1831 Chronic kidney disease, stage 3a: Secondary | ICD-10-CM | POA: Diagnosis not present

## 2021-11-27 DIAGNOSIS — E7800 Pure hypercholesterolemia, unspecified: Secondary | ICD-10-CM | POA: Diagnosis not present

## 2021-11-27 DIAGNOSIS — I4891 Unspecified atrial fibrillation: Secondary | ICD-10-CM | POA: Diagnosis not present

## 2021-11-27 DIAGNOSIS — Z299 Encounter for prophylactic measures, unspecified: Secondary | ICD-10-CM | POA: Diagnosis not present

## 2021-11-29 ENCOUNTER — Ambulatory Visit
Admission: EM | Admit: 2021-11-29 | Discharge: 2021-11-29 | Disposition: A | Discharge status: Home / Self Care | Payer: PPO

## 2021-11-29 DIAGNOSIS — R21 Rash and other nonspecific skin eruption: Secondary | ICD-10-CM

## 2021-11-29 MED ORDER — METHYLPREDNISOLONE SODIUM SUCC 125 MG IJ SOLR
80.0000 mg | Freq: Once | INTRAMUSCULAR | Status: AC
  Administered 2021-11-29: 80 mg via INTRAMUSCULAR

## 2021-11-29 MED ORDER — TRIAMCINOLONE ACETONIDE 0.1 % EX CREA: 1.0000 | TOPICAL_CREAM | Freq: Two times a day (BID) | CUTANEOUS | 0 refills | Status: AC

## 2021-11-29 NOTE — ED Triage Notes (Signed)
Pt reports itching rash in abdomen, back and groins and right leg x 3 days. Benadryl gives some relief.

## 2021-11-29 NOTE — ED Provider Notes (Signed)
[facility]    CSN: [id number] Arrival date & time: 11/29/21  0858      History   Chief Complaint Chief Complaint  Patient presents with   Rash    HPI Douglas Lee is a 76 y.o. male.   Patient presenting today with itchy red bumps to the back of his knees, bilateral groin, scattered across back and abdomen and several on arms.  He noticed them 3 days ago and they seem to be spreading since onset.  Benadryl gives some temporary relief.  Denies new foods, hygiene products or cleaning products, outdoor exposures.  Did get a tick off of himself a few days prior but does not feel its related.  Denies throat itching or swelling, chest tightness, shortness of breath, fever, chills.   Past Medical History:  Diagnosis Date   Atrial fibrillation (HCC)    BiPAP (biphasic positive airway pressure) dependence    COVID-19    Essential hypertension    History of stroke 82/9562   Embolic, right parietal lobe and insular region   Hyperlipemia    Patient Active Problem List   Diagnosis Date Noted   Hypothyroidism due to Hashimoto's thyroiditis 05/08/2020   Bilateral impacted cerumen 05/09/2019   Presbycusis of both ears 05/09/2019   Colon cancer screening 03/16/2019   Treatment-emergent central sleep apnea 12/06/2018   OSA on CPAP 10/05/2018   Snoring 06/09/2018   Paroxysmal atrial fibrillation ([place]) 06/09/2018   Chronic anticoagulation 06/09/2018   Primary osteoarthritis of right knee 04/06/2014   Patellofemoral chondrosis of right knee 04/06/2014   Loose body in knee 03/09/2014   Hyperextension injury 03/09/2014   Right knee pain 03/09/2014    Past Surgical History:  Procedure Laterality Date   COLONOSCOPY     KNEE ARTHROTOMY Right    MINOR HEMORRHOIDECTOMY  07/12/2015     Home Medications    Prior to Admission medications   Medication Sig Start Date End Date Taking? Authorizing Provider  cyanocobalamin (VITAMIN B12) 500 MCG tablet Take 500 mcg by mouth  daily.   Yes [provider]  triamcinolone cream (KENALOG) 0.1 % Apply 1 Application topically 2 (two) times daily. 11/29/21  Yes Volney American, PA-C  amLODipine-benazepril (LOTREL) 5-20 MG capsule Take 1 capsule by mouth daily. 12/27/20   [provider]  apixaban (ELIQUIS) 5 MG TABS tablet Take 1 tablet (5 mg total) by mouth 2 (two) times daily. 07/01/21   Satira Sark, MD  Ascorbic Acid (VITAMIN C PO) Take 1,000 mg by mouth daily.    [provider]  Calcium Carb-Cholecalciferol (OYSTER SHELL CALCIUM W/D) 500-5 MG-MCG TABS Take by mouth.    [provider]  Coenzyme Q10 (CO Q-10 PO) Take 200 mg by mouth daily.    [provider]  cyanocobalamin 1000 MCG tablet Take 1,000 mcg by mouth daily.    [provider]  Garlic 1308 MG CAPS Take 1,000 mg by mouth daily.    [provider]  L-LYSINE PO Take 500 mg by mouth daily.    [provider]  levothyroxine (SYNTHROID) 75 MCG tablet Take 1 tablet (75 mcg total) by mouth daily before breakfast. 06/26/21   Nida, Marella Chimes, MD  metoprolol tartrate (LOPRESSOR) 100 MG tablet Take 1 tablet by mouth twice daily 08/09/21   Satira Sark, MD  Multiple Vitamin (MULTIVITAMIN) tablet Take 1 tablet by mouth daily.    [provider]  Omega-3 Fatty Acids (FISH OIL) 1000 MG CAPS Take 1  capsule by mouth 3 (three) times daily.     [provider]  rosuvastatin (CRESTOR) 10 MG tablet Take 1 tablet by mouth once daily 11/14/21   Satira Sark, MD  Vitamin A 2400 MCG (8000 UT) CAPS Take 1 capsule by mouth daily.    [provider]  VITAMIN D PO Take 50 mcg by mouth daily.    [provider]  ZINC OXIDE PO Take 50 mg by mouth daily in the afternoon.    [provider]   Family History Family History  Problem Relation Age of Onset   Heart attack Father 53   Stroke Mother 21   Hypertension Other    Arrhythmia Brother         PPM   Social History Social History   Tobacco Use   Smoking status: Never   Smokeless tobacco: Never  Vaping Use   Vaping Use: Never used  Substance Use Topics   Alcohol use: No    Alcohol/week: 0.0 standard drinks of alcohol   Drug use: No    Allergies   Elemental sulfur, Lipitor [atorvastatin], and Penicillins   Review of Systems Review of Systems Per HPI  Physical Exam Triage Vital Signs ED Triage Vitals  Enc Vitals Group     BP 11/29/21 0939 127/81     Pulse Rate 11/29/21 0939 75     Resp 11/29/21 0939 16     Temp 11/29/21 0939 97.7 F (36.5 C)     Temp Source 11/29/21 0939 Oral     SpO2 11/29/21 0939 97 %     Weight --      Height --      Head Circumference --      Peak Flow --      Pain Score 11/29/21 0942 0     Pain Loc --      Pain Edu? --      Excl. in [place]? --    No data found.  Updated Vital Signs BP 127/81 (BP Location: Right Arm)   Pulse 75   Temp 97.7 F (36.5 C) (Oral)   Resp 16   SpO2 97%   Visual Acuity Right Eye Distance:   Left Eye Distance:   Bilateral Distance:    Right Eye Near:   Left Eye Near:    Bilateral Near:     Physical Exam Vitals and nursing note reviewed.  Constitutional:      Appearance: Normal appearance.  HENT:     Head: Atraumatic.  Eyes:     Extraocular Movements: Extraocular movements intact.     Conjunctiva/sclera: Conjunctivae normal.  Cardiovascular:     Rate and Rhythm: Normal rate and regular rhythm.  Pulmonary:     Effort: Pulmonary effort is normal.     Breath sounds: Normal breath sounds.  Musculoskeletal:        General: Normal range of motion.     Cervical back: Normal range of motion and neck supple.  Skin:    General: Skin is warm and dry.     Findings: Rash present.     Comments: Small erythematous papules across bilateral groin region, bilateral posterior knees, sporadic across trunk and arms  Neurological:     General: No focal deficit present.     Mental Status: He is oriented to  person, place, and time.  Psychiatric:        Mood and Affect: Mood normal.        Thought Content: Thought content normal.  Judgment: Judgment normal.      UC Treatments / Results  Labs (all labs ordered are listed, but only abnormal results are displayed) Labs Reviewed - No data to display  EKG   Radiology No results found.  Procedures Procedures (including critical care time)  Medications Ordered in UC Medications  methylPREDNISolone sodium succinate (SOLU-MEDROL) 125 mg/2 mL injection 80 mg (80 mg Intramuscular Given 11/29/21 1033)    Initial Impression / Assessment and Plan / UC Course  I have reviewed the triage vital signs and the nursing notes.  Pertinent labs & imaging results that were available during my care of the patient were reviewed by me and considered in my medical decision making (see chart for details).     Unclear etiology of symptoms, possibly allergic dermatitis.  Treat with IM Solu-Medrol, triamcinolone, antihistamines.  Avoid possible irritants.  Return for any worsening symptoms.  Final Clinical Impressions(s) / UC Diagnoses   Final diagnoses:  Rash   Discharge Instructions   None    ED Prescriptions     Medication Sig Dispense Auth. Provider   triamcinolone cream (KENALOG) 0.1 % Apply 1 Application topically 2 (two) times daily. 60 g Volney American, [place]      PDMP not reviewed this encounter.   Volney American, [place] 11/29/21 [phone]

## 2021-12-04 DIAGNOSIS — I10 Essential (primary) hypertension: Secondary | ICD-10-CM | POA: Diagnosis not present

## 2021-12-16 DIAGNOSIS — E0500 Thyrotoxicosis with diffuse goiter without thyrotoxic crisis or storm: Secondary | ICD-10-CM | POA: Diagnosis not present

## 2021-12-16 DIAGNOSIS — E038 Other specified hypothyroidism: Secondary | ICD-10-CM | POA: Diagnosis not present

## 2021-12-18 ENCOUNTER — Encounter: Payer: Self-pay | Admitting: "Endocrinology

## 2021-12-18 ENCOUNTER — Ambulatory Visit: Payer: PPO | Admitting: "Endocrinology

## 2021-12-18 VITALS — BP 98/62 | HR 80 | Ht 70.0 in | Wt 187.2 lb

## 2021-12-18 DIAGNOSIS — E063 Autoimmune thyroiditis: Secondary | ICD-10-CM | POA: Diagnosis not present

## 2021-12-18 DIAGNOSIS — E038 Other specified hypothyroidism: Secondary | ICD-10-CM

## 2021-12-18 MED ORDER — LEVOTHYROXINE SODIUM 75 MCG PO TABS: 75.0000 ug | ORAL_TABLET | Freq: Every day | ORAL | 1 refills | Status: DC

## 2021-12-18 NOTE — Progress Notes (Signed)
12/18/2021    Endocrinology follow-up note   Subjective:    Patient ID: Douglas Lee, male    DOB: 02/19/1946, PCP Glenda Chroman, MD.   Past Medical History:  Diagnosis Date   Atrial fibrillation ([place])    BiPAP (biphasic positive airway pressure) dependence    COVID-19    Essential hypertension    History of stroke 16/1096   Embolic, right parietal lobe and insular region   Hyperlipemia     Past Surgical History:  Procedure Laterality Date   COLONOSCOPY     KNEE ARTHROTOMY Right    MINOR HEMORRHOIDECTOMY  07/12/2015    Social History   Socioeconomic History   Marital status: Married    Spouse name: Not on file   Number of children: Not on file   Years of education: Not on file   Highest education level: Not on file  Occupational History   Not on file  Tobacco Use   Smoking status: Never   Smokeless tobacco: Never  Vaping Use   Vaping Use: Never used  Substance and Sexual Activity   Alcohol use: No    Alcohol/week: 0.0 standard drinks of alcohol   Drug use: No   Sexual activity: Not on file  Other Topics Concern   Not on file  Social History Narrative   Not on file   Social Determinants of Health   Financial Resource Strain: Not on file  Food Insecurity: Not on file  Transportation Needs: Not on file  Physical Activity: Not on file  Stress: Not on file  Social Connections: Not on file    Family History  Problem Relation Age of Onset   Heart attack Father 15   Stroke Mother 22   Hypertension Other    Arrhythmia Brother        PPM    Outpatient Encounter Medications as of 12/18/2021  Medication Sig   amLODipine-benazepril (LOTREL) 5-20 MG capsule Take 1 capsule by mouth daily.   apixaban (ELIQUIS) 5 MG TABS tablet Take 1 tablet (5 mg total) by mouth 2 (two) times daily.   Ascorbic Acid (VITAMIN C PO) Take 1,000 mg by mouth daily.   Calcium Carb-Cholecalciferol (OYSTER SHELL CALCIUM W/D) 500-5 MG-MCG TABS Take by mouth.   Coenzyme  Q10 (CO Q-10 PO) Take 200 mg by mouth daily.   cyanocobalamin (VITAMIN B12) 500 MCG tablet Take 500 mcg by mouth daily.   cyanocobalamin 1000 MCG tablet Take 1,000 mcg by mouth daily.   Garlic 0454 MG CAPS Take 1,000 mg by mouth daily.   L-LYSINE PO Take 500 mg by mouth daily.   levothyroxine (SYNTHROID) 75 MCG tablet Take 1 tablet (75 mcg total) by mouth daily before breakfast.   metoprolol tartrate (LOPRESSOR) 100 MG tablet Take 1 tablet by mouth twice daily   Multiple Vitamin (MULTIVITAMIN) tablet Take 1 tablet by mouth daily.   Omega-3 Fatty Acids (FISH OIL) 1000 MG CAPS Take 1 capsule by mouth 3 (three) times daily.    rosuvastatin (CRESTOR) 10 MG tablet Take 1 tablet by mouth once daily   triamcinolone cream (KENALOG) 0.1 % Apply 1 Application topically 2 (two) times daily.   Vitamin A 2400 MCG (8000 UT) CAPS Take 1 capsule by mouth daily.   VITAMIN D PO Take 50 mcg by mouth daily.   ZINC OXIDE PO Take 50 mg by mouth daily in the afternoon.   [DISCONTINUED] levothyroxine (SYNTHROID) 75 MCG tablet Take 1 tablet (75 mcg total) by  mouth daily before breakfast.   No facility-administered encounter medications on file as of 12/18/2021.    ALLERGIES: Allergies  Allergen Reactions   Elemental Sulfur    Lipitor [Atorvastatin] Other (See Comments)   Penicillins     VACCINATION STATUS: Immunization History  Administered Date(s) Administered   Influenza-Unspecified 01/07/2020   Moderna Sars-Covid-2 Vaccination 05/21/2019, 06/18/2019, 02/10/2020     HPI  Douglas Lee is 76 y.o. male who returns for follow-up in the management of hypothyroidism with repeat thyroid function tests.   PMD: Glenda Chroman, MD.  During his prior visits he had subacute thyroiditis which resolved into partial hypothyroidism.  He was initiated on levothyroxine, currently 75 mcg p.o. daily before breakfast.  He reports consistency and compliance and returns with labs consistent with appropriate replacement.   He has no new complaints today.     he denies dysphagia, choking, shortness of breath, no recent voice change.    he denies family history of thyroid dysfunction nor any family history of thyroid cancer.  he denies personal history of goiter. he is not on any anti-thyroid medications nor on any thyroid hormone supplements.                          Review of systems Limited as above.   Objective:    BP 98/62   Pulse 80   Ht '5\' 10"'$  (1.778 m)   Wt 187 lb 3.2 oz (84.9 kg)   BMI 26.86 kg/m   Wt Readings from Last 3 Encounters:  12/18/21 187 lb 3.2 oz (84.9 kg)  08/19/21 192 lb (87.1 kg)  06/26/21 198 lb 12.8 oz (90.2 kg)                                                Physical exam    CMP     Component Value Date/Time   NA 140 11/30/2018 1441   K 4.5 11/30/2018 1441   CL 105 11/30/2018 1441   CO2 25 11/30/2018 1441   GLUCOSE 95 11/30/2018 1441   BUN 12 02/10/2020 0000   CREATININE 1.0 02/10/2020 0000   CREATININE 1.23 (H) 11/30/2018 1441   CALCIUM 9.9 11/30/2018 1441   GFRNONAA 70 02/10/2020 0000   GFRAA 81 02/10/2020 0000     CBC    Component Value Date/Time   WBC 11.2 (H) 11/30/2018 1441   RBC 4.28 11/30/2018 1441   HGB 13.8 11/30/2018 1441   HCT 41.5 11/30/2018 1441   PLT 305 11/30/2018 1441   MCV 97.0 11/30/2018 1441   MCH 32.2 11/30/2018 1441   MCHC 33.3 11/30/2018 1441   RDW 12.2 11/30/2018 1441      Lipid Panel  No results found for: "CHOL", "TRIG", "HDL", "CHOLHDL", "VLDL", "LDLCALC", "LDLDIRECT", "LABVLDL"   Lab Results  Component Value Date   TSH 3.22 06/19/2021   TSH 1.18 12/17/2020   TSH 2.62 06/18/2020   TSH 7.29 (A) 05/02/2020   TSH 0.02 (A) 02/02/2020    February 13, 2020 lab work: Free T4 2.05-elevated, TSH 0.019-suppressed, total T4 14.5-elevated Labs on February 06, 2020: TSH 0.027 suppressed   Assessment & Plan:   1.  Hypothyroidism due to Hashimoto's thyroiditis  His previsit thyroid function tests are consistent with  appropriate replacement.  He is advised to continue levothyroxine 75 mcg p.o. daily  before breakfast.     - We discussed about the correct intake of his thyroid hormone, on empty stomach at fasting, with water, separated by at least 30 minutes from breakfast and other medications,  and separated by more than 4 hours from calcium, iron, multivitamins, acid reflux medications (PPIs). -Patient is made aware of the fact that thyroid hormone replacement is needed for life, dose to be adjusted by periodic monitoring of thyroid function tests.  He is already on beta-blocker related to his atrial fibrillation.  He is rate controlled at 60-89.  His blood pressure is controlled.    -Patient is advised to maintain close follow up with Glenda Chroman, MD for primary care needs.   I spent 21 minutes in the care of the patient today including review of labs from Thyroid Function, CMP, and other relevant labs ; imaging/biopsy records (current and previous including abstractions from other facilities); face-to-face time discussing  his lab results and symptoms, medications doses, his options of short and long term treatment based on the latest standards of care / guidelines;   and documenting the encounter.  [name]  participated in the discussions, expressed understanding, and voiced agreement with the above plans.  All questions were answered to his satisfaction. he is encouraged to contact clinic should he have any questions or concerns prior to his return visit.   Follow up plan: Return in about 6 months (around 06/18/2022) for F/U with Pre-visit Labs.   Thank you for involving me in the care of this pleasant patient, and I will continue to update you with his progress.  Glade Lloyd, MD [facility] Phone: [phone]  Fax: [phone]   12/18/2021, 7:52 PM  This note was partially dictated with voice recognition software. Similar sounding words  can be transcribed inadequately or may not  be corrected upon review.

## 2021-12-26 ENCOUNTER — Ambulatory Visit: Payer: PPO | Admitting: "Endocrinology

## 2021-12-30 DIAGNOSIS — M79671 Pain in right foot: Secondary | ICD-10-CM | POA: Diagnosis not present

## 2021-12-30 DIAGNOSIS — S91301A Unspecified open wound, right foot, initial encounter: Secondary | ICD-10-CM | POA: Diagnosis not present

## 2021-12-30 DIAGNOSIS — M722 Plantar fascial fibromatosis: Secondary | ICD-10-CM | POA: Diagnosis not present

## 2022-01-02 DIAGNOSIS — L570 Actinic keratosis: Secondary | ICD-10-CM | POA: Diagnosis not present

## 2022-01-02 DIAGNOSIS — X32XXXD Exposure to sunlight, subsequent encounter: Secondary | ICD-10-CM | POA: Diagnosis not present

## 2022-01-03 DIAGNOSIS — I10 Essential (primary) hypertension: Secondary | ICD-10-CM | POA: Diagnosis not present

## 2022-02-03 DIAGNOSIS — I10 Essential (primary) hypertension: Secondary | ICD-10-CM | POA: Diagnosis not present

## 2022-02-18 ENCOUNTER — Telehealth: Payer: Self-pay | Admitting: Cardiology

## 2022-02-18 ENCOUNTER — Other Ambulatory Visit: Payer: Self-pay | Admitting: Cardiology

## 2022-02-18 DIAGNOSIS — I480 Paroxysmal atrial fibrillation: Secondary | ICD-10-CM

## 2022-02-18 MED ORDER — APIXABAN 5 MG PO TABS: 5.0000 mg | ORAL_TABLET | Freq: Two times a day (BID) | ORAL | 0 refills | Status: DC

## 2022-02-18 NOTE — Telephone Encounter (Signed)
Request for Eliquis '5mg'$  samples: Indication: PAF Last office visit: 02/22/21  Lawanda Cousins NP  (Has appt with Dr Domenic Polite 03/04/22) Scr: 0.94 on 02/13/22 Age: 76 Weight: 87.3kg  Based on above findings Eliquis '5mg'$  twice daily is the appropriate dose.  Ok to provide samples if available.  Pt will need orders for CBC/BMP when he comes for appt with Dr Domenic Polite.

## 2022-02-18 NOTE — Addendum Note (Signed)
Addended by: Merlene Laughter on: 02/18/2022 03:15 PM   Modules accepted: Orders

## 2022-02-18 NOTE — Telephone Encounter (Signed)
In donut hole. Need eliqus samples.

## 2022-02-18 NOTE — Telephone Encounter (Signed)
Patient wants to speak to nurse about his medication, didn't want to go into further detail.

## 2022-02-26 DIAGNOSIS — Z789 Other specified health status: Secondary | ICD-10-CM | POA: Diagnosis not present

## 2022-02-26 DIAGNOSIS — R5383 Other fatigue: Secondary | ICD-10-CM | POA: Diagnosis not present

## 2022-02-26 DIAGNOSIS — Z0000 Encounter for general adult medical examination without abnormal findings: Secondary | ICD-10-CM | POA: Diagnosis not present

## 2022-02-26 DIAGNOSIS — Z1331 Encounter for screening for depression: Secondary | ICD-10-CM | POA: Diagnosis not present

## 2022-02-26 DIAGNOSIS — I10 Essential (primary) hypertension: Secondary | ICD-10-CM | POA: Diagnosis not present

## 2022-02-26 DIAGNOSIS — E7800 Pure hypercholesterolemia, unspecified: Secondary | ICD-10-CM | POA: Diagnosis not present

## 2022-02-26 DIAGNOSIS — Z6827 Body mass index (BMI) 27.0-27.9, adult: Secondary | ICD-10-CM | POA: Diagnosis not present

## 2022-02-26 DIAGNOSIS — Z79899 Other long term (current) drug therapy: Secondary | ICD-10-CM | POA: Diagnosis not present

## 2022-02-26 DIAGNOSIS — Z7189 Other specified counseling: Secondary | ICD-10-CM | POA: Diagnosis not present

## 2022-02-26 DIAGNOSIS — Z299 Encounter for prophylactic measures, unspecified: Secondary | ICD-10-CM | POA: Diagnosis not present

## 2022-02-26 DIAGNOSIS — Z125 Encounter for screening for malignant neoplasm of prostate: Secondary | ICD-10-CM | POA: Diagnosis not present

## 2022-02-26 DIAGNOSIS — Z1339 Encounter for screening examination for other mental health and behavioral disorders: Secondary | ICD-10-CM | POA: Diagnosis not present

## 2022-03-03 NOTE — Progress Notes (Unsigned)
Cardiology Office Note  Date: 03/04/2022   ID: Douglas Lee, Douglas Lee Dec 30, 1945, MRN [id number]  PCP:  Glenda Chroman, MD  Cardiologist:  Rozann Lesches, MD Electrophysiologist:  None   Chief Complaint  Patient presents with   Cardiac follow-up    History of Present Illness: Douglas Lee is a 76 y.o. male last seen in November 2022 by Mr. Douglas Lee, I reviewed the note.  He is here for a routine visit.  States that he feels very well, no sense of palpitations, no exertional chest pain or breathlessness beyond NYHA class I.  We went over his medications which are stable from a cardiac perspective.  He does not report any spontaneous bleeding problems on Eliquis.  I did go over his recent lab work which is reviewed below.  Today's ECG shows rate controlled atrial fibrillation with decreased R wave progression.  Past Medical History:  Diagnosis Date   Atrial fibrillation (HCC)    BiPAP (biphasic positive airway pressure) dependence    COVID-19    Essential hypertension    History of stroke 15/1761   Embolic, right parietal lobe and insular region   Hyperlipemia     Past Surgical History:  Procedure Laterality Date   COLONOSCOPY     KNEE ARTHROTOMY Right    MINOR HEMORRHOIDECTOMY  07/12/2015    Current Outpatient Medications  Medication Sig Dispense Refill   amLODipine-benazepril (LOTREL) 5-20 MG capsule Take 1 capsule by mouth daily.     apixaban (ELIQUIS) 5 MG TABS tablet Take 1 tablet (5 mg total) by mouth 2 (two) times daily. 84 tablet 0   Ascorbic Acid (VITAMIN C PO) Take 1,000 mg by mouth daily.     Calcium Carb-Cholecalciferol (OYSTER SHELL CALCIUM W/D) 500-5 MG-MCG TABS Take by mouth.     Coenzyme Q10 (CO Q-10 PO) Take 200 mg by mouth daily.     cyanocobalamin (VITAMIN B12) 500 MCG tablet Take 500 mcg by mouth daily.     cyanocobalamin 1000 MCG tablet Take 1,000 mcg by mouth daily.     Garlic 6073 MG CAPS Take 1,000 mg by mouth daily.     L-LYSINE PO Take 500 mg  by mouth daily.     levothyroxine (SYNTHROID) 75 MCG tablet Take 1 tablet (75 mcg total) by mouth daily before breakfast. 90 tablet 1   metoprolol tartrate (LOPRESSOR) 100 MG tablet Take 1 tablet by mouth twice daily 180 tablet 2   Multiple Vitamin (MULTIVITAMIN) tablet Take 1 tablet by mouth daily.     Omega-3 Fatty Acids (FISH OIL) 1000 MG CAPS Take 1 capsule by mouth 3 (three) times daily.      rosuvastatin (CRESTOR) 10 MG tablet Take 1 tablet by mouth once daily 90 tablet 0   triamcinolone cream (KENALOG) 0.1 % Apply 1 Application topically 2 (two) times daily. 60 g 0   Vitamin A 2400 MCG (8000 UT) CAPS Take 1 capsule by mouth daily.     VITAMIN D PO Take 50 mcg by mouth daily.     ZINC OXIDE PO Take 50 mg by mouth daily in the afternoon.     No current facility-administered medications for this visit.   Allergies:  Elemental sulfur, Lipitor [atorvastatin], and Penicillins   ROS: No orthopnea or PND.  Physical Exam: VS:  BP (!) 140/76   Pulse 64   Ht '5\' 10"'$  (1.778 m)   Wt 186 lb (84.4 kg)   BMI 26.69 kg/m , BMI Body mass index is  26.69 kg/m.  Wt Readings from Last 3 Encounters:  03/04/22 186 lb (84.4 kg)  12/18/21 187 lb 3.2 oz (84.9 kg)  08/19/21 192 lb (87.1 kg)    General: Patient appears comfortable at rest. HEENT: Conjunctiva and lids normal. Neck: Supple, no elevated JVP or carotid bruits. Lungs: Clear to auscultation, nonlabored breathing at rest. Cardiac: Irregularly irregular, no S3 or significant systolic murmur. Extremities: No pitting edema.  ECG:  An ECG dated 12/08/2020 was personally reviewed today and demonstrated:  Atrial fibrillation with leftward axis, poor R wave progression.  Recent Labwork: 06/19/2021: TSH 3.26 February 2022: Hemoglobin 14.1, platelets 386, BUN 13, creatinine 1.17, potassium 4.8, AST 23, ALT 23, cholesterol 128, triglycerides 104, HDL 52, LDL 57, TSH 2.01  Other Studies Reviewed Today:  Echocardiogram 10/28/2017 [facility]): LVEF 60 to 65%, severe left atrial enlargement, moderate right atrial enlargement, sclerotic aortic valve with trivial aortic regurgitation, mild mitral regurgitation, mild tricuspid regurgitation, no pericardial effusion.  Assessment and Plan:  1.  Permanent atrial fibrillation with CHA2DS2-VASc score of 5.  He is doing well without active symptoms and continues on Lopressor and Eliquis.  Refill provided.  I reviewed his recent lab work and he does not report any spontaneous bleeding problems.  2.  Mixed hyperlipidemia on Crestor with recent LDL 57.  Medication Adjustments/Labs and Tests Ordered: Current medicines are reviewed at length with the patient today.  Concerns regarding medicines are outlined above.   Tests Ordered: Orders Placed This Encounter  Procedures   EKG 12-Lead    Medication Changes: No orders of the defined types were placed in this encounter.   Disposition:  Follow up  1 year.  Signed, Satira Sark, MD, [facility] 03/04/2022 1:51 PM    [facility] [facility] [address], [place], [place] [address] Phone: [phone]; Fax: [phone]

## 2022-03-04 ENCOUNTER — Encounter: Payer: Self-pay | Admitting: Cardiology

## 2022-03-04 ENCOUNTER — Other Ambulatory Visit: Payer: Self-pay | Admitting: *Deleted

## 2022-03-04 ENCOUNTER — Ambulatory Visit: Payer: PPO | Attending: Cardiology | Admitting: Cardiology

## 2022-03-04 VITALS — BP 140/76 | HR 64 | Ht 70.0 in | Wt 186.0 lb

## 2022-03-04 DIAGNOSIS — E782 Mixed hyperlipidemia: Secondary | ICD-10-CM | POA: Diagnosis not present

## 2022-03-04 DIAGNOSIS — I480 Paroxysmal atrial fibrillation: Secondary | ICD-10-CM

## 2022-03-04 DIAGNOSIS — I4821 Permanent atrial fibrillation: Secondary | ICD-10-CM | POA: Diagnosis not present

## 2022-03-04 MED ORDER — APIXABAN 5 MG PO TABS: 5.0000 mg | ORAL_TABLET | Freq: Two times a day (BID) | ORAL | 5 refills | Status: DC

## 2022-03-04 NOTE — Patient Instructions (Signed)
Medication Instructions:  Your physician recommends that you continue on your current medications as directed. Please refer to the Current Medication list given to you today.   Labwork: None today  Testing/Procedures: None today  Follow-Up: 1 year [place] office  Any Other Special Instructions Will Be Listed Below (If Applicable).  If you need a refill on your cardiac medications before your next appointment, please call your pharmacy.

## 2022-03-04 NOTE — Telephone Encounter (Signed)
Prescription refill request for Eliquis received. Indication: AF Last office visit: 03/04/22  Myles Gip MD Scr: 1.17 on 02/26/22 Age: 76 Weight: 84.4kg  Based on above findings Eliquis '5mg'$  twice daily is the appropriate dose.  Refill approved.

## 2022-03-05 DIAGNOSIS — I10 Essential (primary) hypertension: Secondary | ICD-10-CM | POA: Diagnosis not present

## 2022-03-07 DIAGNOSIS — D6869 Other thrombophilia: Secondary | ICD-10-CM | POA: Diagnosis not present

## 2022-03-07 DIAGNOSIS — I10 Essential (primary) hypertension: Secondary | ICD-10-CM | POA: Diagnosis not present

## 2022-03-07 DIAGNOSIS — Z299 Encounter for prophylactic measures, unspecified: Secondary | ICD-10-CM | POA: Diagnosis not present

## 2022-03-07 DIAGNOSIS — M545 Low back pain, unspecified: Secondary | ICD-10-CM | POA: Diagnosis not present

## 2022-03-20 DIAGNOSIS — J069 Acute upper respiratory infection, unspecified: Secondary | ICD-10-CM | POA: Diagnosis not present

## 2022-03-20 DIAGNOSIS — R0981 Nasal congestion: Secondary | ICD-10-CM | POA: Diagnosis not present

## 2022-03-20 DIAGNOSIS — Z299 Encounter for prophylactic measures, unspecified: Secondary | ICD-10-CM | POA: Diagnosis not present

## 2022-04-04 DIAGNOSIS — I10 Essential (primary) hypertension: Secondary | ICD-10-CM | POA: Diagnosis not present

## 2022-04-08 ENCOUNTER — Telehealth: Payer: Self-pay | Admitting: Family Medicine

## 2022-04-08 NOTE — Telephone Encounter (Signed)
Pt said, woke up at 4:30 am and BIPAP machine was blowing so hard as if sticking my head out a car window. Would like a call from the nurse.

## 2022-04-08 NOTE — Telephone Encounter (Signed)
Please call pt back and instruct him to call [facility] (DME that provided machine) to make sure machine functioning properly. Phone: [phone]

## 2022-04-17 NOTE — Telephone Encounter (Signed)
LVM informing pt to call [facility] to make sure BIPAP machine is working properly. Can call back if have any questions.

## 2022-04-21 DIAGNOSIS — M722 Plantar fascial fibromatosis: Secondary | ICD-10-CM | POA: Diagnosis not present

## 2022-04-21 DIAGNOSIS — M79671 Pain in right foot: Secondary | ICD-10-CM | POA: Diagnosis not present

## 2022-04-22 DIAGNOSIS — H35372 Puckering of macula, left eye: Secondary | ICD-10-CM | POA: Diagnosis not present

## 2022-05-03 ENCOUNTER — Other Ambulatory Visit: Payer: Self-pay | Admitting: Cardiology

## 2022-05-05 DIAGNOSIS — I10 Essential (primary) hypertension: Secondary | ICD-10-CM | POA: Diagnosis not present

## 2022-05-07 DIAGNOSIS — I10 Essential (primary) hypertension: Secondary | ICD-10-CM | POA: Diagnosis not present

## 2022-05-07 DIAGNOSIS — E7800 Pure hypercholesterolemia, unspecified: Secondary | ICD-10-CM | POA: Diagnosis not present

## 2022-05-09 DIAGNOSIS — N1831 Chronic kidney disease, stage 3a: Secondary | ICD-10-CM | POA: Diagnosis not present

## 2022-05-09 DIAGNOSIS — D6869 Other thrombophilia: Secondary | ICD-10-CM | POA: Diagnosis not present

## 2022-05-09 DIAGNOSIS — Z299 Encounter for prophylactic measures, unspecified: Secondary | ICD-10-CM | POA: Diagnosis not present

## 2022-05-09 DIAGNOSIS — I4891 Unspecified atrial fibrillation: Secondary | ICD-10-CM | POA: Diagnosis not present

## 2022-05-09 DIAGNOSIS — I10 Essential (primary) hypertension: Secondary | ICD-10-CM | POA: Diagnosis not present

## 2022-05-22 DIAGNOSIS — I10 Essential (primary) hypertension: Secondary | ICD-10-CM | POA: Diagnosis not present

## 2022-05-22 DIAGNOSIS — I4891 Unspecified atrial fibrillation: Secondary | ICD-10-CM | POA: Diagnosis not present

## 2022-05-22 DIAGNOSIS — Z299 Encounter for prophylactic measures, unspecified: Secondary | ICD-10-CM | POA: Diagnosis not present

## 2022-05-22 DIAGNOSIS — R600 Localized edema: Secondary | ICD-10-CM | POA: Diagnosis not present

## 2022-05-26 DIAGNOSIS — R600 Localized edema: Secondary | ICD-10-CM | POA: Diagnosis not present

## 2022-05-29 DIAGNOSIS — D6869 Other thrombophilia: Secondary | ICD-10-CM | POA: Diagnosis not present

## 2022-05-29 DIAGNOSIS — Z299 Encounter for prophylactic measures, unspecified: Secondary | ICD-10-CM | POA: Diagnosis not present

## 2022-05-29 DIAGNOSIS — I10 Essential (primary) hypertension: Secondary | ICD-10-CM | POA: Diagnosis not present

## 2022-05-29 DIAGNOSIS — I4891 Unspecified atrial fibrillation: Secondary | ICD-10-CM | POA: Diagnosis not present

## 2022-05-29 DIAGNOSIS — N1831 Chronic kidney disease, stage 3a: Secondary | ICD-10-CM | POA: Diagnosis not present

## 2022-06-04 DIAGNOSIS — I10 Essential (primary) hypertension: Secondary | ICD-10-CM | POA: Diagnosis not present

## 2022-06-05 ENCOUNTER — Encounter: Payer: Self-pay | Admitting: Radiology

## 2022-06-05 DIAGNOSIS — I10 Essential (primary) hypertension: Secondary | ICD-10-CM | POA: Diagnosis not present

## 2022-06-05 DIAGNOSIS — E7800 Pure hypercholesterolemia, unspecified: Secondary | ICD-10-CM | POA: Diagnosis not present

## 2022-06-11 DIAGNOSIS — E0500 Thyrotoxicosis with diffuse goiter without thyrotoxic crisis or storm: Secondary | ICD-10-CM | POA: Diagnosis not present

## 2022-06-11 DIAGNOSIS — E038 Other specified hypothyroidism: Secondary | ICD-10-CM | POA: Diagnosis not present

## 2022-06-18 ENCOUNTER — Encounter: Payer: Self-pay | Admitting: "Endocrinology

## 2022-06-18 ENCOUNTER — Ambulatory Visit (INDEPENDENT_AMBULATORY_CARE_PROVIDER_SITE_OTHER): Payer: PPO | Admitting: "Endocrinology

## 2022-06-18 VITALS — BP 120/78 | HR 56 | Ht 70.0 in | Wt 200.0 lb

## 2022-06-18 DIAGNOSIS — E038 Other specified hypothyroidism: Secondary | ICD-10-CM

## 2022-06-18 DIAGNOSIS — E063 Autoimmune thyroiditis: Secondary | ICD-10-CM | POA: Diagnosis not present

## 2022-06-18 MED ORDER — LEVOTHYROXINE SODIUM 88 MCG PO TABS: 88.0000 ug | ORAL_TABLET | Freq: Every day | ORAL | 1 refills | Status: DC

## 2022-06-18 NOTE — Progress Notes (Signed)
06/18/2022    Endocrinology follow-up note   Subjective:    Patient ID: Douglas Lee, male    DOB: April 16, 1945, PCP Douglas Chroman, MD.   Past Medical History:  Diagnosis Date   Atrial fibrillation ([place])    BiPAP (biphasic positive airway pressure) dependence    COVID-19    Essential hypertension    History of stroke [id number]   Embolic, right parietal lobe and insular region   Hyperlipemia     Past Surgical History:  Procedure Laterality Date   COLONOSCOPY     KNEE ARTHROTOMY Right    MINOR HEMORRHOIDECTOMY  07/12/2015    Social History   Socioeconomic History   Marital status: Married    Spouse name: Not on file   Number of children: Not on file   Years of education: Not on file   Highest education level: Not on file  Occupational History   Not on file  Tobacco Use   Smoking status: Never   Smokeless tobacco: Never  Vaping Use   Vaping Use: Never used  Substance and Sexual Activity   Alcohol use: No    Alcohol/week: 0.0 standard drinks of alcohol   Drug use: No   Sexual activity: Not on file  Other Topics Concern   Not on file  Social History Narrative   Not on file   Social Determinants of Health   Financial Resource Strain: Not on file  Food Insecurity: Not on file  Transportation Needs: Not on file  Physical Activity: Not on file  Stress: Not on file  Social Connections: Not on file    Family History  Problem Relation Age of Onset   Heart attack Father 41   Stroke Mother 15   Hypertension Other    Arrhythmia Brother        PPM    Outpatient Encounter Medications as of 06/18/2022  Medication Sig   amLODipine-benazepril (LOTREL) 5-20 MG capsule Take 1 capsule by mouth daily.   apixaban (ELIQUIS) 5 MG TABS tablet Take 1 tablet (5 mg total) by mouth 2 (two) times daily.   Ascorbic Acid (VITAMIN C PO) Take 1,000 mg by mouth daily.   Calcium Carb-Cholecalciferol (OYSTER SHELL CALCIUM W/D) 500-5 MG-MCG TABS Take by mouth.   Coenzyme  Q10 (CO Q-10 PO) Take 200 mg by mouth daily.   cyanocobalamin (VITAMIN B12) 500 MCG tablet Take 500 mcg by mouth daily.   cyanocobalamin 1000 MCG tablet Take 1,000 mcg by mouth daily.   Garlic [id number] MG CAPS Take 1,000 mg by mouth daily.   L-LYSINE PO Take 500 mg by mouth daily.   levothyroxine (SYNTHROID) 88 MCG tablet Take 1 tablet (88 mcg total) by mouth daily before breakfast.   metoprolol tartrate (LOPRESSOR) 100 MG tablet Take 1 tablet by mouth twice daily   Multiple Vitamin (MULTIVITAMIN) tablet Take 1 tablet by mouth daily.   Omega-3 Fatty Acids (FISH OIL) 1000 MG CAPS Take 1 capsule by mouth 3 (three) times daily.    rosuvastatin (CRESTOR) 10 MG tablet Take 1 tablet by mouth once daily   triamcinolone cream (KENALOG) 0.1 % Apply 1 Application topically 2 (two) times daily.   Vitamin A 2400 MCG (8000 UT) CAPS Take 1 capsule by mouth daily.   VITAMIN D PO Take 50 mcg by mouth daily.   ZINC OXIDE PO Take 50 mg by mouth daily in the afternoon.   [DISCONTINUED] levothyroxine (SYNTHROID) 75 MCG tablet Take 1 tablet (75 mcg total) by  mouth daily before breakfast.   No facility-administered encounter medications on file as of 06/18/2022.    ALLERGIES: Allergies  Allergen Reactions   Elemental Sulfur    Lipitor [Atorvastatin] Other (See Comments)   Penicillins     VACCINATION STATUS: Immunization History  Administered Date(s) Administered   Influenza-Unspecified 01/07/2020   Moderna Sars-Covid-2 Vaccination 05/21/2019, 06/18/2019, 02/10/2020     HPI  Douglas Lee is 77 y.o. male who returns for follow-up in the management of hypothyroidism with repeat thyroid function tests.   PMD: Douglas Chroman, MD.  During his prior visits he had subacute thyroiditis which resolved into partial hypothyroidism.  He was initiated on levothyroxine 75 mcg p.o. daily before breakfast.   He reports consistency and compliance and returns with labs consistent with appropriate replacement.  He has no  new complaints today.     he denies dysphagia, choking, shortness of breath, no recent voice change.    he denies family history of thyroid dysfunction nor any family history of thyroid cancer.  he denies personal history of goiter. he is not on any anti-thyroid medications nor on any thyroid hormone supplements.                          Review of systems Limited as above.   Objective:    BP 120/78   Pulse (!) 56   Ht '5\' 10"'$  (1.778 m)   Wt 200 lb (90.7 kg)   BMI 28.70 kg/m   Wt Readings from Last 3 Encounters:  06/18/22 200 lb (90.7 kg)  03/04/22 186 lb (84.4 kg)  12/18/21 187 lb 3.2 oz (84.9 kg)                                                Physical exam    CMP     Component Value Date/Time   NA 140 11/30/2018 1441   K 4.5 11/30/2018 1441   CL 105 11/30/2018 1441   CO2 25 11/30/2018 1441   GLUCOSE 95 11/30/2018 1441   BUN 12 02/10/2020 0000   CREATININE 1.0 02/10/2020 0000   CREATININE 1.23 (H) 11/30/2018 1441   CALCIUM 9.9 11/30/2018 1441   GFRNONAA 70 02/10/2020 0000   GFRAA 81 02/10/2020 0000     CBC    Component Value Date/Time   WBC 11.2 (H) 11/30/2018 1441   RBC 4.28 11/30/2018 1441   HGB 13.8 11/30/2018 1441   HCT 41.5 11/30/2018 1441   PLT 305 11/30/2018 1441   MCV 97.0 11/30/2018 1441   MCH 32.2 11/30/2018 1441   MCHC 33.3 11/30/2018 1441   RDW 12.2 11/30/2018 1441      Lipid Panel  No results found for: "CHOL", "TRIG", "HDL", "CHOLHDL", "VLDL", "LDLCALC", "LDLDIRECT", "LABVLDL"   Lab Results  Component Value Date   TSH 3.22 06/19/2021   TSH 1.18 12/17/2020   TSH 2.62 06/18/2020   TSH 7.29 (A) 05/02/2020   TSH 0.02 (A) 02/02/2020    February 13, 2020 lab work: Free T4 2.05-elevated, TSH 0.019-suppressed, total T4 14.5-elevated Labs on February 06, 2020: TSH 0.027 suppressed   Assessment & Plan:   1.  Hypothyroidism due to Hashimoto's thyroiditis  His previsit thyroid function tests are such that he may benefit from slight  increase in his levothyroxine dose.  I discussed and increase his  levothyroxine to 88 mcg p.o. daily before breakfast.     - We discussed about the correct intake of his thyroid hormone, on empty stomach at fasting, with water, separated by at least 30 minutes from breakfast and other medications,  and separated by more than 4 hours from calcium, iron, multivitamins, acid reflux medications (PPIs). -Patient is made aware of the fact that thyroid hormone replacement is needed for life, dose to be adjusted by periodic monitoring of thyroid function tests.  He is already on beta-blocker related to his atrial fibrillation.  He is rate controlled at 60-89.  His blood pressure is controlled.    -Patient is advised to maintain close follow up with Douglas Chroman, MD for primary care needs.   I spent  20  minutes in the care of the patient today including review of labs from Thyroid Function, CMP, and other relevant labs ; imaging/biopsy records (current and previous including abstractions from other facilities); face-to-face time discussing  his lab results and symptoms, medications doses, his options of short and long term treatment based on the latest standards of care / guidelines;   and documenting the encounter.  [name]  participated in the discussions, expressed understanding, and voiced agreement with the above plans.  All questions were answered to his satisfaction. he is encouraged to contact clinic should he have any questions or concerns prior to his return visit.    Follow up plan: Return in about 6 months (around 12/19/2022) for F/U with Pre-visit Labs.   Thank you for involving me in the care of this pleasant patient, and I will continue to update you with his progress.  Glade Lloyd, MD [facility] [place] [facility] Phone: [phone]  Fax: [phone]   06/18/2022, 12:56 PM  This note was partially dictated with voice recognition software.  Similar sounding words can be transcribed inadequately or may not  be corrected upon review.

## 2022-07-03 DIAGNOSIS — L570 Actinic keratosis: Secondary | ICD-10-CM | POA: Diagnosis not present

## 2022-07-03 DIAGNOSIS — X32XXXD Exposure to sunlight, subsequent encounter: Secondary | ICD-10-CM | POA: Diagnosis not present

## 2022-07-03 DIAGNOSIS — L718 Other rosacea: Secondary | ICD-10-CM | POA: Diagnosis not present

## 2022-07-03 DIAGNOSIS — L218 Other seborrheic dermatitis: Secondary | ICD-10-CM | POA: Diagnosis not present

## 2022-07-09 DIAGNOSIS — D6869 Other thrombophilia: Secondary | ICD-10-CM | POA: Diagnosis not present

## 2022-07-09 DIAGNOSIS — I10 Essential (primary) hypertension: Secondary | ICD-10-CM | POA: Diagnosis not present

## 2022-07-09 DIAGNOSIS — I4891 Unspecified atrial fibrillation: Secondary | ICD-10-CM | POA: Diagnosis not present

## 2022-07-09 DIAGNOSIS — Z299 Encounter for prophylactic measures, unspecified: Secondary | ICD-10-CM | POA: Diagnosis not present

## 2022-07-14 DIAGNOSIS — Z299 Encounter for prophylactic measures, unspecified: Secondary | ICD-10-CM | POA: Diagnosis not present

## 2022-07-14 DIAGNOSIS — I10 Essential (primary) hypertension: Secondary | ICD-10-CM | POA: Diagnosis not present

## 2022-07-14 DIAGNOSIS — I4891 Unspecified atrial fibrillation: Secondary | ICD-10-CM | POA: Diagnosis not present

## 2022-07-14 DIAGNOSIS — I482 Chronic atrial fibrillation, unspecified: Secondary | ICD-10-CM | POA: Diagnosis not present

## 2022-07-28 DIAGNOSIS — Z299 Encounter for prophylactic measures, unspecified: Secondary | ICD-10-CM | POA: Diagnosis not present

## 2022-07-28 DIAGNOSIS — I10 Essential (primary) hypertension: Secondary | ICD-10-CM | POA: Diagnosis not present

## 2022-07-28 DIAGNOSIS — M545 Low back pain, unspecified: Secondary | ICD-10-CM | POA: Diagnosis not present

## 2022-08-20 NOTE — Progress Notes (Signed)
PATIENT: Douglas Lee DOB: May 12, 1945  REASON FOR VISIT: follow up HISTORY FROM: patient  Chief Complaint  Patient presents with   Room 1    Pt is here Alone. Pt states that his BiPAP has been going great. Pt states that he has had the best sleep if his life. Pt states that he doesn't have any complaints about BiPAP.      HISTORY OF PRESENT ILLNESS:  08/25/22 ALL:  [name] returns for follow up for complex sleep apnea on ASV. He continues to do well. He is using ASV nightly for about 7 hours. He reports doing excellent on therapy. He denies concerns with machine or supplies. Cardiology has asked he sleep on his right side. Mask seems to leak more when on his right side. He has not changes mask recently.     08/19/2021 ALL: [name] returns for follow up for CSA on ASV. He continues to do well on therapy. He is using ASV every night. He feels well rested when using ASV and reports sleeping "great". He has noticed more of a leak over the past few months. His cardiologist recommends he sleeps on his left side for atrial fib. He knows that his apnea was worse on his side with original testing (non supine AHI 8.7). He also reports not changing his mask. He is not sure he has ever changed out his mask.     08/13/2020 ALL:  [name] returns for folow up for central sleep apnea managed with ASV. He continues to do very well with therapy. He is using ASV nightly. He is sleeping well. He is using a FFM. No leaks.     08/10/2019 ALL:  [name] is a 77 y.o. male here today for follow up of central sleep apnea on ASV therapy.  He continues to note significant improvements in how he feels.  He wakes in the morning full of energy.  He is very happy using ASV therapy.  He denies any concerns with his machine or supplies.  Compliance report dated 07/10/2019 through 08/08/2019 reveals that he used ASV therapy 30 of the past 30 days for compliance of 100%.  He used ASV greater than 4 hours all 30 days  for compliance of 100%.  Average usage was 7 hours and 42 minutes.  Residual AHI is 3.5 with a EPAP of 5 cm of water minimum pressure support of 3 cm of water and maximum pressure support of 15 cm of water.  There was no leak noted.   HISTORY: (copied from my note on 02/09/2019)  Douglas Lee is a 77 y.o. male here today for follow up for central sleep apnea on ASV. He was previously treated with CPAP therapy but continued to have apneic events and felt that he was not getting enough air with CPAP. Compliance report reviewed and showed central apnea >19/hr. He was switched to ASV following titration study. He reports that he is feeing fantastic. He notes a significant improvement in sleep quality and increased energy levels. He denies any concerns with ASV.    Compliance report dated 01/08/2019 through 02/06/2019 reveals that he is using ASV nightly for compliance 100%.  He is using ASV greater than 4 hours every night for compliance of 100%.  Average usage was 7 hours and 54 minutes.  AHI is now 5.8 with EPAP of 5 cm of water, minimum pressure support of 3 cm of water and maximum pressure support of 15 cm of water.  There  is no significant leak noted.   HISTORY: (copied from my note on 10/05/2018)   Douglas Lee is a 77 y.o. male here today for follow up for OSA on CPAP.  He reports that he has been using his CPAP machine every night.  He is frustrated as his mask does not fit well.  He continues to note a significant leak when using his mask.  He has reached out to [place] who was very helpful, however, they were unable to test the fit due to the pandemic.  He has a mustache and feels that this is inhibiting a good seal.  He does report benefit from using the machine.  He feels that he is resting a little better.  AHI remains elevated and significant leak is noted on the following download report.     09/05/2018 - 10/04/2018   Compliance Report Usage 09/05/2018 - 10/04/2018 Usage  days 29/30 days (97%) >= 4 hours 18 days (60%) < 4 hours 11 days (37%) Usage hours 131 hours 56 minutes Average usage (total days) 4 hours 24 minutes Average usage (days used) 4 hours 33 minutes Median usage (days used) 4 hours 53 minutes Total used hours (value since last reset - 10/04/2018) 142 hours AirSense 10 AutoSet Serial number [id number] Mode AutoSet Min Pressure 5 cmH2O Max Pressure 16 cmH2O EPR Fulltime EPR level 2 Response Standard Therapy Pressure - cmH2O Median: 6.3 95th percentile: 8.8 Maximum: 9.8 Leaks - L/min Median: 28.4 95th percentile: 49.6 Maximum: 61.1 Events per hour AI: 30.3 HI: 0.4 AHI: 30.7 Apnea Index Central: 7.9 Obstructive: 1.8 Unknown: 20.5 RERA Index 0.6 Cheyne-Stokes respiration (average duration per night) 1 hours 7 minutes (21%)   REVIEW OF SYSTEMS: Out of a complete 14 system review of symptoms, the patient complains only of the following symptoms, none and all other reviewed systems are negative.  ESS: 0/24   ALLERGIES: Allergies  Allergen Reactions   Elemental Sulfur    Lipitor [Atorvastatin] Other (See Comments)   Penicillins     HOME MEDICATIONS: Outpatient Medications Prior to Visit  Medication Sig Dispense Refill   amLODipine-benazepril (LOTREL) 5-20 MG capsule Take 1 capsule by mouth daily.     apixaban (ELIQUIS) 5 MG TABS tablet Take 1 tablet (5 mg total) by mouth 2 (two) times daily. 60 tablet 5   Ascorbic Acid (VITAMIN C PO) Take 1,000 mg by mouth daily.     Calcium Carb-Cholecalciferol (OYSTER SHELL CALCIUM W/D) 500-5 MG-MCG TABS Take by mouth.     Coenzyme Q10 (CO Q-10 PO) Take 200 mg by mouth daily.     cyanocobalamin (VITAMIN B12) 500 MCG tablet Take 500 mcg by mouth daily.     cyanocobalamin 1000 MCG tablet Take 1,000 mcg by mouth daily.     Garlic 1000 MG CAPS Take 1,000 mg by mouth daily.     L-LYSINE PO Take 500 mg by mouth daily.     levothyroxine (SYNTHROID) 88 MCG tablet Take 1 tablet (88 mcg total) by mouth  daily before breakfast. 90 tablet 1   metoprolol tartrate (LOPRESSOR) 100 MG tablet Take 1 tablet by mouth twice daily 180 tablet 3   Multiple Vitamin (MULTIVITAMIN) tablet Take 1 tablet by mouth daily.     Omega-3 Fatty Acids (FISH OIL) 1000 MG CAPS Take 1 capsule by mouth 3 (three) times daily.      rosuvastatin (CRESTOR) 10 MG tablet Take 1 tablet by mouth once daily 90 tablet 3   triamcinolone cream (KENALOG) 0.1 %  Apply 1 Application topically 2 (two) times daily. 60 g 0   Vitamin A 2400 MCG (8000 UT) CAPS Take 1 capsule by mouth daily.     VITAMIN D PO Take 50 mcg by mouth daily.     ZINC OXIDE PO Take 50 mg by mouth daily in the afternoon.     No facility-administered medications prior to visit.    PAST MEDICAL HISTORY: Past Medical History:  Diagnosis Date   Atrial fibrillation ([facility])    BiPAP (biphasic positive airway pressure) dependence    COVID-19    Essential hypertension    History of stroke 10/2017   Embolic, right parietal lobe and insular region   Hyperlipemia     PAST SURGICAL HISTORY: Past Surgical History:  Procedure Laterality Date   COLONOSCOPY     KNEE ARTHROTOMY Right    MINOR HEMORRHOIDECTOMY  07/12/2015    FAMILY HISTORY: Family History  Problem Relation Age of Onset   Heart attack Father 85   Stroke Mother 19   Hypertension Other    Arrhythmia Brother        PPM    SOCIAL HISTORY: Social History   Socioeconomic History   Marital status: Married    Spouse name: Not on file   Number of children: Not on file   Years of education: Not on file   Highest education level: Not on file  Occupational History   Not on file  Tobacco Use   Smoking status: Never   Smokeless tobacco: Never  Vaping Use   Vaping Use: Never used  Substance and Sexual Activity   Alcohol use: No    Alcohol/week: 0.0 standard drinks of alcohol   Drug use: No   Sexual activity: Not on file  Other Topics Concern   Not on file  Social History Narrative   Right Handed    1 Cup of Coffee Per Day   Social Determinants of Health   Financial Resource Strain: Not on file  Food Insecurity: Not on file  Transportation Needs: Not on file  Physical Activity: Not on file  Stress: Not on file  Social Connections: Not on file  Intimate Partner Violence: Not on file      PHYSICAL EXAM  Vitals:   08/25/22 0757 08/25/22 0839  BP: (!) 158/82 120/68  Pulse: 61   Weight: 185 lb (83.9 kg)   Height: 5\' 10"  (1.778 m)      Body mass index is 26.54 kg/m.  Generalized: Well developed, in no acute distress  Cardiology: normal rate and rhythm, no murmur noted Respiratory: clear to auscultation bilaterally  Neurological examination  Mentation: Alert oriented to time, place, history taking. Follows all commands speech and language fluent Cranial nerve II-XII: Pupils were equal round reactive to light. Extraocular movements were full, visual field were full for the year Motor: The motor testing reveals 5 over 5 strength of all 4 extremities. Good symmetric motor tone is noted throughout.   Gait and station: Gait is normal.    DIAGNOSTIC DATA (LABS, IMAGING, TESTING) - I reviewed patient records, labs, notes, testing and imaging myself where available.      No data to display           Lab Results  Component Value Date   WBC 11.2 (H) 11/30/2018   HGB 13.8 11/30/2018   HCT 41.5 11/30/2018   MCV 97.0 11/30/2018   PLT 305 11/30/2018      Component Value Date/Time   NA 140 11/30/2018 1441  K 4.5 11/30/2018 1441   CL 105 11/30/2018 1441   CO2 25 11/30/2018 1441   GLUCOSE 95 11/30/2018 1441   BUN 12 02/10/2020 0000   CREATININE 1.0 02/10/2020 0000   CREATININE 1.23 (H) 11/30/2018 1441   CALCIUM 9.9 11/30/2018 1441   GFRNONAA 70 02/10/2020 0000   GFRAA 81 02/10/2020 0000   No results found for: "CHOL", "HDL", "LDLCALC", "LDLDIRECT", "TRIG", "CHOLHDL" No results found for: "HGBA1C" No results found for: "VITAMINB12" Lab Results  Component  Value Date   TSH 3.22 06/19/2021     ASSESSMENT AND PLAN 77 y.o. year old male  has a past medical history of Atrial fibrillation (HCC), BiPAP (biphasic positive airway pressure) dependence, COVID-19, Essential hypertension, History of stroke (10/2017), and Hyperlipemia. here with     ICD-10-CM   1. Complex sleep apnea syndrome  G47.31 For home use only DME Bipap    2. Encounter for counseling on adaptive servo-ventilation (ASV) use  Z71.89 For home use only DME Bipap      Lish is doing fantastic on ASV therapy. Compliance report reveals excellent compliance. I will send transfer of care for new DME. He was encouraged to change supplies regularly. He will continue close follow-up with primary care and cardiology.  Healthy lifestyle habits encouraged.  He will return for follow-up in 1 year, sooner if needed.  He verbalizes understanding and agreement with this plan.   Orders Placed This Encounter  Procedures   For home use only DME Bipap    Transfer of care    Order Specific Question:   Length of Need    Answer:   Lifetime    Order Specific Question:   Inspiratory pressure    Answer:   OTHER SEE COMMENTS    Order Specific Question:   Expiratory pressure    Answer:   OTHER SEE COMMENTS     No orders of the defined types were placed in this encounter.        Christena Deem 08/25/2022, 8:39 AM [facility] [address] [place], [place] [address] [phone]

## 2022-08-20 NOTE — Patient Instructions (Addendum)
Please continue using your ASV regularly. While your insurance requires that you use ASV at least 4 hours each night on 70% of the nights, I recommend, that you not skip any nights and use it throughout the night if you can. Getting used to ASV and staying with the treatment long term does take time and patience and discipline. Untreated obstructive sleep apnea when it is moderate to severe can have an adverse impact on cardiovascular health and raise her risk for heart disease, arrhythmias, hypertension, congestive heart failure, stroke and diabetes. Untreated obstructive sleep apnea causes sleep disruption, nonrestorative sleep, and sleep deprivation. This can have an impact on your day to day functioning and cause daytime sleepiness and impairment of cognitive function, memory loss, mood disturbance, and problems focussing. Using ASV regularly can improve these symptoms.  We will update supply orders, today. I will send you to a new DME, preferably [facility].   Follow up in 1 year

## 2022-08-25 ENCOUNTER — Telehealth: Payer: Self-pay

## 2022-08-25 ENCOUNTER — Encounter: Payer: Self-pay | Admitting: Family Medicine

## 2022-08-25 ENCOUNTER — Ambulatory Visit (INDEPENDENT_AMBULATORY_CARE_PROVIDER_SITE_OTHER): Payer: PPO | Admitting: Family Medicine

## 2022-08-25 VITALS — BP 120/68 | HR 61 | Ht 70.0 in | Wt 185.0 lb

## 2022-08-25 DIAGNOSIS — Z7189 Other specified counseling: Secondary | ICD-10-CM

## 2022-08-25 DIAGNOSIS — G4731 Primary central sleep apnea: Secondary | ICD-10-CM

## 2022-08-25 NOTE — Telephone Encounter (Signed)
Faxed BiPAP Supply Order to [facility] 08/25/2022. Fax went through.

## 2022-09-11 ENCOUNTER — Other Ambulatory Visit: Payer: Self-pay | Admitting: "Endocrinology

## 2022-09-18 DIAGNOSIS — H5203 Hypermetropia, bilateral: Secondary | ICD-10-CM | POA: Diagnosis not present

## 2022-09-18 DIAGNOSIS — H35372 Puckering of macula, left eye: Secondary | ICD-10-CM | POA: Diagnosis not present

## 2022-09-22 ENCOUNTER — Telehealth: Payer: Self-pay | Admitting: Cardiology

## 2022-09-22 ENCOUNTER — Encounter: Payer: Self-pay | Admitting: *Deleted

## 2022-09-22 NOTE — Telephone Encounter (Signed)
Reports he is seeing a surgeon tomorrow regarding cataract surgery and asking if he should hold his eliquis. Advised to have surgeon's office contact us directly with this request. Verbalized understanding of plan.

## 2022-09-22 NOTE — Telephone Encounter (Signed)
Patient called to talk with Dr. Diona Browner or nurse in regards to surgery. Please call back. Would not give further details

## 2022-09-23 DIAGNOSIS — H25811 Combined forms of age-related cataract, right eye: Secondary | ICD-10-CM | POA: Diagnosis not present

## 2022-09-23 DIAGNOSIS — H25812 Combined forms of age-related cataract, left eye: Secondary | ICD-10-CM | POA: Diagnosis not present

## 2022-09-23 DIAGNOSIS — Z01818 Encounter for other preprocedural examination: Secondary | ICD-10-CM | POA: Diagnosis not present

## 2022-09-25 DIAGNOSIS — R42 Dizziness and giddiness: Secondary | ICD-10-CM | POA: Diagnosis not present

## 2022-09-25 DIAGNOSIS — Z299 Encounter for prophylactic measures, unspecified: Secondary | ICD-10-CM | POA: Diagnosis not present

## 2022-09-25 DIAGNOSIS — I10 Essential (primary) hypertension: Secondary | ICD-10-CM | POA: Diagnosis not present

## 2022-10-06 HISTORY — PX: EYE SURGERY: SHX253

## 2022-10-08 DIAGNOSIS — H25812 Combined forms of age-related cataract, left eye: Secondary | ICD-10-CM | POA: Diagnosis not present

## 2022-10-29 DIAGNOSIS — H25811 Combined forms of age-related cataract, right eye: Secondary | ICD-10-CM | POA: Diagnosis not present

## 2022-12-03 DIAGNOSIS — E0500 Thyrotoxicosis with diffuse goiter without thyrotoxic crisis or storm: Secondary | ICD-10-CM | POA: Diagnosis not present

## 2022-12-03 DIAGNOSIS — E063 Autoimmune thyroiditis: Secondary | ICD-10-CM | POA: Diagnosis not present

## 2022-12-03 DIAGNOSIS — E038 Other specified hypothyroidism: Secondary | ICD-10-CM | POA: Diagnosis not present

## 2022-12-15 DIAGNOSIS — H00015 Hordeolum externum left lower eyelid: Secondary | ICD-10-CM | POA: Diagnosis not present

## 2022-12-18 ENCOUNTER — Ambulatory Visit: Payer: PPO | Admitting: "Endocrinology

## 2022-12-18 ENCOUNTER — Encounter: Payer: Self-pay | Admitting: "Endocrinology

## 2022-12-18 ENCOUNTER — Telehealth: Payer: Self-pay

## 2022-12-18 VITALS — BP 116/64 | HR 84 | Ht 70.0 in | Wt 188.2 lb

## 2022-12-18 DIAGNOSIS — E063 Autoimmune thyroiditis: Secondary | ICD-10-CM

## 2022-12-18 DIAGNOSIS — E038 Other specified hypothyroidism: Secondary | ICD-10-CM | POA: Diagnosis not present

## 2022-12-18 MED ORDER — LEVOTHYROXINE SODIUM 88 MCG PO TABS: 88.0000 ug | ORAL_TABLET | Freq: Every day | ORAL | 1 refills | Status: DC

## 2022-12-18 NOTE — Telephone Encounter (Signed)
Discussed with pt, understanding voiced. 

## 2022-12-18 NOTE — Progress Notes (Signed)
12/18/2022    Endocrinology follow-up note   Subjective:    Patient ID: Douglas Lee, male    DOB: May 02, 1945, PCP Douglas Specking, MD.   Past Medical History:  Diagnosis Date   Atrial fibrillation (HCC)    BiPAP (biphasic positive airway pressure) dependence    COVID-19    Essential hypertension    History of stroke 10/2017   Embolic, right parietal lobe and insular region   Hyperlipemia     Past Surgical History:  Procedure Laterality Date   COLONOSCOPY     EYE SURGERY  10/2022   KNEE ARTHROTOMY Right    MINOR HEMORRHOIDECTOMY  07/12/2015    Social History   Socioeconomic History   Marital status: Married    Spouse name: Not on file   Number of children: Not on file   Years of education: Not on file   Highest education level: Not on file  Occupational History   Not on file  Tobacco Use   Smoking status: Never   Smokeless tobacco: Never  Vaping Use   Vaping status: Never Used  Substance and Sexual Activity   Alcohol use: No    Alcohol/week: 0.0 standard drinks of alcohol   Drug use: No   Sexual activity: Not on file  Other Topics Concern   Not on file  Social History Narrative   Right Handed   1 Cup of Coffee Per Day   Social Determinants of Health   Financial Resource Strain: Not on file  Food Insecurity: Not on file  Transportation Needs: Not on file  Physical Activity: Not on file  Stress: Not on file  Social Connections: Not on file    Family History  Problem Relation Age of Onset   Heart attack Father 59   Stroke Mother 69   Hypertension Other    Arrhythmia Brother        PPM    Outpatient Encounter Medications as of 12/18/2022  Medication Sig   amLODipine-benazepril (LOTREL) 5-20 MG capsule Take 1 capsule by mouth daily.   apixaban (ELIQUIS) 5 MG TABS tablet Take 1 tablet (5 mg total) by mouth 2 (two) times daily.   Ascorbic Acid (VITAMIN C PO) Take 1,000 mg by mouth daily.   Calcium Carb-Cholecalciferol (OYSTER SHELL  CALCIUM W/D) 500-5 MG-MCG TABS Take by mouth.   Coenzyme Q10 (CO Q-10 PO) Take 200 mg by mouth daily.   cyanocobalamin (VITAMIN B12) 500 MCG tablet Take 500 mcg by mouth daily.   cyanocobalamin 1000 MCG tablet Take 1,000 mcg by mouth daily.   Garlic 1000 MG CAPS Take 1,000 mg by mouth daily.   L-LYSINE PO Take 500 mg by mouth daily.   levothyroxine (SYNTHROID) 88 MCG tablet Take 1 tablet (88 mcg total) by mouth daily before breakfast.   metoprolol tartrate (LOPRESSOR) 100 MG tablet Take 1 tablet by mouth twice daily   Multiple Vitamin (MULTIVITAMIN) tablet Take 1 tablet by mouth daily.   Omega-3 Fatty Acids (FISH OIL) 1000 MG CAPS Take 1 capsule by mouth 3 (three) times daily.    rosuvastatin (CRESTOR) 10 MG tablet Take 1 tablet by mouth once daily   triamcinolone cream (KENALOG) 0.1 % Apply 1 Application topically 2 (two) times daily.   Vitamin A 2400 MCG (8000 UT) CAPS Take 1 capsule by mouth daily.   VITAMIN D PO Take 50 mcg by mouth daily.   ZINC OXIDE PO Take 50 mg by mouth daily in the afternoon.   [  DISCONTINUED] levothyroxine (SYNTHROID) 88 MCG tablet Take 1 tablet (88 mcg total) by mouth daily before breakfast.   [DISCONTINUED] levothyroxine (SYNTHROID) 88 MCG tablet Take 1 tablet (88 mcg total) by mouth daily.   No facility-administered encounter medications on file as of 12/18/2022.    ALLERGIES: Allergies  Allergen Reactions   Elemental Sulfur    Lipitor [Atorvastatin] Other (See Comments)   Penicillins     VACCINATION STATUS: Immunization History  Administered Date(s) Administered   Influenza-Unspecified 01/07/2020   Moderna Sars-Covid-2 Vaccination 05/21/2019, 06/18/2019, 02/10/2020     HPI  Douglas Lee is 77 y.o. male who returns for follow-up in the management of hypothyroidism with repeat thyroid function tests.   PMD: Douglas Specking, MD.  During his prior visits he had subacute thyroiditis which resolved into partial hypothyroidism.  He continues to  respond to levothyroxine replacement, currently on 88 mcg p.o. daily before breakfast.   He reports consistency and compliance and returns with labs consistent with appropriate replacement.  He has no new complaints today.     he denies dysphagia, choking, shortness of breath, no recent voice change.    he denies family history of thyroid dysfunction nor any family history of thyroid cancer.  he denies personal history of goiter. he is not on any anti-thyroid medications nor on any thyroid hormone supplements.                          Review of systems Limited as above.   Objective:    BP 116/64   Pulse 84   Ht 5\' 10"  (1.778 m)   Wt 188 lb 3.2 oz (85.4 kg)   BMI 27.00 kg/m   Wt Readings from Last 3 Encounters:  12/18/22 188 lb 3.2 oz (85.4 kg)  08/25/22 185 lb (83.9 kg)  06/18/22 200 lb (90.7 kg)                                                Physical exam    CMP     Component Value Date/Time   NA 140 11/30/2018 1441   K 4.5 11/30/2018 1441   CL 105 11/30/2018 1441   CO2 25 11/30/2018 1441   GLUCOSE 95 11/30/2018 1441   BUN 12 02/10/2020 0000   CREATININE 1.0 02/10/2020 0000   CREATININE 1.23 (H) 11/30/2018 1441   CALCIUM 9.9 11/30/2018 1441   GFRNONAA 70 02/10/2020 0000   GFRAA 81 02/10/2020 0000     CBC    Component Value Date/Time   WBC 11.2 (H) 11/30/2018 1441   RBC 4.28 11/30/2018 1441   HGB 13.8 11/30/2018 1441   HCT 41.5 11/30/2018 1441   PLT 305 11/30/2018 1441   MCV 97.0 11/30/2018 1441   MCH 32.2 11/30/2018 1441   MCHC 33.3 11/30/2018 1441   RDW 12.2 11/30/2018 1441       Lab Results  Component Value Date   TSH 3.22 06/19/2021   TSH 1.18 12/17/2020   TSH 2.62 06/18/2020   TSH 7.29 (A) 05/02/2020   TSH 0.02 (A) 02/02/2020    February 13, 2020 lab work: Free T4 2.05-elevated, TSH 0.019-suppressed, total T4 14.5-elevated Labs on February 06, 2020: TSH 0.027 suppressed   Assessment & Plan:   1.  Hypothyroidism due to Hashimoto's  thyroiditis  His previsit thyroid function tests  are consistent with appropriate replacement.  He is advised to continue levothyroxine 88 mg p.o. daily before breakfast.     - We discussed about the correct intake of his thyroid hormone, on empty stomach at fasting, with water, separated by at least 30 minutes from breakfast and other medications,  and separated by more than 4 hours from calcium, iron, multivitamins, acid reflux medications (PPIs). -Patient is made aware of the fact that thyroid hormone replacement is needed for life, dose to be adjusted by periodic monitoring of thyroid function tests.   He is already on beta-blocker related to his atrial fibrillation.  He is rate controlled at 60-89.  His blood pressure is controlled.    -Patient is advised to maintain close follow up with Douglas Specking, MD for primary care needs.    I spent  22  minutes in the care of the patient today including review of labs from Thyroid Function, CMP, and other relevant labs ; imaging/biopsy records (current and previous including abstractions from other facilities); face-to-face time discussing  his lab results and symptoms, medications doses, his options of short and long term treatment based on the latest standards of care / guidelines;   and documenting the encounter.  Aggie Moats  participated in the discussions, expressed understanding, and voiced agreement with the above plans.  All questions were answered to his satisfaction. he is encouraged to contact clinic should he have any questions or concerns prior to his return visit.     Follow up plan: Return in about 6 months (around 06/17/2023) for Fasting Labs  in AM B4 8.   Thank you for involving me in the care of this pleasant patient, and I will continue to update you with his progress.  Marquis Lunch, MD [facility] Phone: [phone]  Fax: [phone]   12/18/2022, 4:58 PM  This note  was partially dictated with voice recognition software. Similar sounding words can be transcribed inadequately or may not  be corrected upon review.

## 2022-12-18 NOTE — Telephone Encounter (Signed)
Pt called asking if you want him to have his T4,Free drawn since they left it out of his last lab work.

## 2022-12-22 ENCOUNTER — Ambulatory Visit: Payer: PPO | Admitting: "Endocrinology

## 2022-12-30 ENCOUNTER — Telehealth: Payer: Self-pay | Admitting: "Endocrinology

## 2022-12-30 NOTE — Telephone Encounter (Signed)
Thats fine with me

## 2022-12-30 NOTE — Telephone Encounter (Signed)
Patient is asking can he switch from Dr Fransico Him to [place] - Patient states that he did not like how he was approached by Dr Fransico Him at his last visit because [facility] did not complete all the labs. He states that was not his fault because he took them the paper. Patient thinks its best that he switch. If you ok this, let me know and I will move his appt

## 2023-01-01 DIAGNOSIS — A630 Anogenital (venereal) warts: Secondary | ICD-10-CM | POA: Diagnosis not present

## 2023-01-01 DIAGNOSIS — L820 Inflamed seborrheic keratosis: Secondary | ICD-10-CM | POA: Diagnosis not present

## 2023-01-01 DIAGNOSIS — L570 Actinic keratosis: Secondary | ICD-10-CM | POA: Diagnosis not present

## 2023-01-01 DIAGNOSIS — X32XXXD Exposure to sunlight, subsequent encounter: Secondary | ICD-10-CM | POA: Diagnosis not present

## 2023-01-15 DIAGNOSIS — D6869 Other thrombophilia: Secondary | ICD-10-CM | POA: Diagnosis not present

## 2023-01-15 DIAGNOSIS — I482 Chronic atrial fibrillation, unspecified: Secondary | ICD-10-CM | POA: Diagnosis not present

## 2023-01-15 DIAGNOSIS — I10 Essential (primary) hypertension: Secondary | ICD-10-CM | POA: Diagnosis not present

## 2023-01-15 DIAGNOSIS — Z299 Encounter for prophylactic measures, unspecified: Secondary | ICD-10-CM | POA: Diagnosis not present

## 2023-01-15 DIAGNOSIS — N1831 Chronic kidney disease, stage 3a: Secondary | ICD-10-CM | POA: Diagnosis not present

## 2023-02-09 DIAGNOSIS — J069 Acute upper respiratory infection, unspecified: Secondary | ICD-10-CM | POA: Diagnosis not present

## 2023-02-09 DIAGNOSIS — I4891 Unspecified atrial fibrillation: Secondary | ICD-10-CM | POA: Diagnosis not present

## 2023-02-09 DIAGNOSIS — N1831 Chronic kidney disease, stage 3a: Secondary | ICD-10-CM | POA: Diagnosis not present

## 2023-02-09 DIAGNOSIS — I10 Essential (primary) hypertension: Secondary | ICD-10-CM | POA: Diagnosis not present

## 2023-02-12 DIAGNOSIS — R5383 Other fatigue: Secondary | ICD-10-CM | POA: Diagnosis not present

## 2023-02-12 DIAGNOSIS — J069 Acute upper respiratory infection, unspecified: Secondary | ICD-10-CM | POA: Diagnosis not present

## 2023-02-12 DIAGNOSIS — I4891 Unspecified atrial fibrillation: Secondary | ICD-10-CM | POA: Diagnosis not present

## 2023-02-24 ENCOUNTER — Telehealth: Payer: Self-pay | Admitting: Cardiology

## 2023-02-24 DIAGNOSIS — I480 Paroxysmal atrial fibrillation: Secondary | ICD-10-CM

## 2023-02-24 MED ORDER — APIXABAN 5 MG PO TABS: 5.0000 mg | ORAL_TABLET | Freq: Two times a day (BID) | ORAL | 0 refills | Status: DC

## 2023-02-24 NOTE — Telephone Encounter (Signed)
Patient calling the office for samples of medication: ° ° °1.  What medication and dosage are you requesting samples for? Eliquis ° °2.  Are you currently out of this medication?  A few ° ° ° °

## 2023-02-24 NOTE — Telephone Encounter (Signed)
Sample request for Eliquis received. Indication: AF Last office visit: 03/04/22 Scr: 1/17 on 02/26/22  Epic Age: 77 Weight: 84.4kg  Based on above findings Eliquis 5mg  twice daily is the appropriate dose.  OK to provide samples if available.

## 2023-02-24 NOTE — Telephone Encounter (Signed)
Called to notify pt that samples have been placed at front desk for pick-up. No answer. Left msg to call back.

## 2023-02-24 NOTE — Telephone Encounter (Signed)
Patient made aware of message below.

## 2023-02-25 ENCOUNTER — Other Ambulatory Visit: Payer: Self-pay | Admitting: *Deleted

## 2023-02-25 DIAGNOSIS — E063 Autoimmune thyroiditis: Secondary | ICD-10-CM

## 2023-02-25 MED ORDER — LEVOTHYROXINE SODIUM 88 MCG PO TABS: 88.0000 ug | ORAL_TABLET | Freq: Every day | ORAL | 1 refills | Status: DC

## 2023-03-02 ENCOUNTER — Encounter: Payer: Self-pay | Admitting: Cardiology

## 2023-03-02 ENCOUNTER — Ambulatory Visit: Payer: PPO | Attending: Cardiology | Admitting: Cardiology

## 2023-03-02 VITALS — BP 118/78 | HR 68 | Ht 70.0 in | Wt 187.6 lb

## 2023-03-02 DIAGNOSIS — E782 Mixed hyperlipidemia: Secondary | ICD-10-CM

## 2023-03-02 DIAGNOSIS — Z79899 Other long term (current) drug therapy: Secondary | ICD-10-CM | POA: Diagnosis not present

## 2023-03-02 DIAGNOSIS — I4821 Permanent atrial fibrillation: Secondary | ICD-10-CM | POA: Diagnosis not present

## 2023-03-02 DIAGNOSIS — I480 Paroxysmal atrial fibrillation: Secondary | ICD-10-CM | POA: Diagnosis not present

## 2023-03-02 NOTE — Patient Instructions (Addendum)
Medication Instructions:  Your physician recommends that you continue on your current medications as directed. Please refer to the Current Medication list given to you today.  Labwork: Your physician recommends that you return for a FASTING lipid profile, BMET & CBC. Please do not eat or drink for at least 8 hours when you have this done. You may take your medications that morning with a sip of water. [facility]  Testing/Procedures: none  Follow-Up: Your physician recommends that you schedule a follow-up appointment in: 1 year. You will receive a reminder call in about 10 months reminding you to schedule your appointment. If you don't receive this call, please contact our office.  Any Other Special Instructions Will Be Listed Below (If Applicable).  If you need a refill on your cardiac medications before your next appointment, please call your pharmacy.

## 2023-03-02 NOTE — Progress Notes (Signed)
    Cardiology Office Note  Date: 03/02/2023   ID: [name], [name] Sep 06, 1945, MRN [id number]  History of Present Illness: Douglas Lee is a 77 y.o. male last seen in November 2023.  He is here for a follow-up visit.  States that he feels "better than good."  He does not describe any palpitations or exertional chest pain, no unusual shortness of breath with activity.  No dizziness or syncope.  I reviewed his medications.  Current cardiovascular regimen includes Eliquis, HCTZ, lisinopril, Lopressor, and Crestor.  He has not undergone recent lab work which we discussed.  Does not describe any spontaneous bleeding problems.  I reviewed his ECG today which shows rate controlled atrial fibrillation.  Physical Exam: VS:  BP 118/78   Pulse 68   Ht 5\' 10"  (1.778 m)   Wt 187 lb 9.6 oz (85.1 kg)   BMI 26.92 kg/m , BMI Body mass index is 26.92 kg/m.  Wt Readings from Last 3 Encounters:  03/02/23 187 lb 9.6 oz (85.1 kg)  12/18/22 188 lb 3.2 oz (85.4 kg)  08/25/22 185 lb (83.9 kg)    General: Patient appears comfortable at rest. HEENT: Conjunctiva and lids normal. Neck: Supple, no elevated JVP or carotid bruits. Lungs: Clear to auscultation, nonlabored breathing at rest. Cardiac: Irregularly irregular, no gallop or significant murmur.. Abdomen: Bowel sounds present, no bruits.  ECG:  An ECG dated 03/04/2022 was personally reviewed today and demonstrated:  Rate controlled atrial fibrillation with decreased R wave progression.  Labwork:  November 2023: Hemoglobin 14.1, platelets 386, BUN 13, creatinine 1.17, potassium 4.8, AST 23, ALT 23, cholesterol 128, triglycerides 104, HDL 52, LDL 57, TSH 2.06 November 2022: TSH 1.614  Other Studies Reviewed Today:  No interval cardiac testing for review today.  Assessment and Plan:  1.  Permanent atrial fibrillation with CHA2DS2-VASc score of 5.  Asymptomatic with no description of palpitations, heart rate controlled on Lopressor.  He  remains on Eliquis for stroke prophylaxis, no spontaneous bleeding problems reported.  Check CBC and BMET.   2.  Mixed hyperlipidemia on Crestor.  Check FLP.  3.  Primary hypertension.  Blood pressure is well-controlled today.  He is on lisinopril and HCTZ.  Keep follow-up with PCP.  Disposition:  Follow up  1 year.  Signed, Jonelle Sidle, M.D., F.A.C.C. [facility]

## 2023-03-10 DIAGNOSIS — I10 Essential (primary) hypertension: Secondary | ICD-10-CM | POA: Diagnosis not present

## 2023-03-10 DIAGNOSIS — Z1331 Encounter for screening for depression: Secondary | ICD-10-CM | POA: Diagnosis not present

## 2023-03-10 DIAGNOSIS — Z79899 Other long term (current) drug therapy: Secondary | ICD-10-CM | POA: Diagnosis not present

## 2023-03-10 DIAGNOSIS — R5383 Other fatigue: Secondary | ICD-10-CM | POA: Diagnosis not present

## 2023-03-10 DIAGNOSIS — Z0000 Encounter for general adult medical examination without abnormal findings: Secondary | ICD-10-CM | POA: Diagnosis not present

## 2023-03-10 DIAGNOSIS — Z7189 Other specified counseling: Secondary | ICD-10-CM | POA: Diagnosis not present

## 2023-03-10 DIAGNOSIS — Z299 Encounter for prophylactic measures, unspecified: Secondary | ICD-10-CM | POA: Diagnosis not present

## 2023-03-10 DIAGNOSIS — E7800 Pure hypercholesterolemia, unspecified: Secondary | ICD-10-CM | POA: Diagnosis not present

## 2023-03-10 DIAGNOSIS — Z1339 Encounter for screening examination for other mental health and behavioral disorders: Secondary | ICD-10-CM | POA: Diagnosis not present

## 2023-03-11 ENCOUNTER — Telehealth: Payer: Self-pay

## 2023-03-11 NOTE — Telephone Encounter (Signed)
Patient called today wanting to schedule a sleep study but I do not have orders for a sleep study. If patient is to have a sleep study can someone please place orders and let Irving Burton know so that we can get the auth from insurance and schedule him. Thanks!!

## 2023-03-11 NOTE — Telephone Encounter (Addendum)
Checked resmed [employer]. He was set up with current machine 12/14/2018.  Called and spoke with pt. He spoke w/ insurance and told its been awhile since last SS. Told to f/u on this.   Last SS:    Confirmed current machine working well. Having no issues. He will keep current machine. Does not want to do repeat sleep study unless he needs to. He also wants to keep current machine as long as possible. He will let us know if he has any issues moving forward.

## 2023-03-23 DIAGNOSIS — E782 Mixed hyperlipidemia: Secondary | ICD-10-CM | POA: Diagnosis not present

## 2023-03-23 DIAGNOSIS — I4821 Permanent atrial fibrillation: Secondary | ICD-10-CM | POA: Diagnosis not present

## 2023-03-23 DIAGNOSIS — L814 Other melanin hyperpigmentation: Secondary | ICD-10-CM | POA: Diagnosis not present

## 2023-03-23 DIAGNOSIS — D485 Neoplasm of uncertain behavior of skin: Secondary | ICD-10-CM | POA: Diagnosis not present

## 2023-03-23 DIAGNOSIS — L821 Other seborrheic keratosis: Secondary | ICD-10-CM | POA: Diagnosis not present

## 2023-03-23 DIAGNOSIS — D0439 Carcinoma in situ of skin of other parts of face: Secondary | ICD-10-CM | POA: Diagnosis not present

## 2023-03-23 DIAGNOSIS — I480 Paroxysmal atrial fibrillation: Secondary | ICD-10-CM | POA: Diagnosis not present

## 2023-03-23 DIAGNOSIS — L570 Actinic keratosis: Secondary | ICD-10-CM | POA: Diagnosis not present

## 2023-03-23 DIAGNOSIS — Z79899 Other long term (current) drug therapy: Secondary | ICD-10-CM | POA: Diagnosis not present

## 2023-04-14 DIAGNOSIS — I4891 Unspecified atrial fibrillation: Secondary | ICD-10-CM | POA: Diagnosis not present

## 2023-04-14 DIAGNOSIS — N1831 Chronic kidney disease, stage 3a: Secondary | ICD-10-CM | POA: Diagnosis not present

## 2023-04-14 DIAGNOSIS — J329 Chronic sinusitis, unspecified: Secondary | ICD-10-CM | POA: Diagnosis not present

## 2023-04-14 DIAGNOSIS — J3489 Other specified disorders of nose and nasal sinuses: Secondary | ICD-10-CM | POA: Diagnosis not present

## 2023-04-14 DIAGNOSIS — Z299 Encounter for prophylactic measures, unspecified: Secondary | ICD-10-CM | POA: Diagnosis not present

## 2023-04-30 DIAGNOSIS — C44329 Squamous cell carcinoma of skin of other parts of face: Secondary | ICD-10-CM | POA: Diagnosis not present

## 2023-05-10 ENCOUNTER — Other Ambulatory Visit: Payer: Self-pay | Admitting: Cardiology

## 2023-05-10 DIAGNOSIS — I480 Paroxysmal atrial fibrillation: Secondary | ICD-10-CM

## 2023-06-08 ENCOUNTER — Encounter: Payer: Self-pay | Admitting: "Endocrinology

## 2023-06-08 DIAGNOSIS — E038 Other specified hypothyroidism: Secondary | ICD-10-CM | POA: Diagnosis not present

## 2023-06-08 DIAGNOSIS — E0500 Thyrotoxicosis with diffuse goiter without thyrotoxic crisis or storm: Secondary | ICD-10-CM | POA: Diagnosis not present

## 2023-06-08 LAB — LAB REPORT - SCANNED
Free T4: 1.21 ng/dL
TSH: 1.14 (ref 0.41–5.90)

## 2023-06-17 NOTE — Patient Instructions (Signed)

## 2023-06-19 ENCOUNTER — Ambulatory Visit: Payer: PPO | Admitting: Nurse Practitioner

## 2023-06-19 ENCOUNTER — Encounter: Payer: Self-pay | Admitting: Nurse Practitioner

## 2023-06-19 VITALS — BP 110/70 | HR 62 | Ht 70.0 in | Wt 185.0 lb

## 2023-06-19 DIAGNOSIS — E063 Autoimmune thyroiditis: Secondary | ICD-10-CM | POA: Diagnosis not present

## 2023-06-19 MED ORDER — LEVOTHYROXINE SODIUM 88 MCG PO TABS: 88.0000 ug | ORAL_TABLET | Freq: Every day | ORAL | 3 refills | Status: DC

## 2023-06-19 NOTE — Progress Notes (Signed)
 06/19/2023    Endocrinology follow-up note   Subjective:    Patient ID: Douglas Lee, male    DOB: October 08, 1945, PCP Ignatius Specking, MD.   Past Medical History:  Diagnosis Date   Atrial fibrillation (HCC)    BiPAP (biphasic positive airway pressure) dependence    COVID-19    Essential hypertension    History of stroke 10/2017   Embolic, right parietal lobe and insular region   Hyperlipemia     Past Surgical History:  Procedure Laterality Date   COLONOSCOPY     EYE SURGERY  10/2022   KNEE ARTHROTOMY Right    MINOR HEMORRHOIDECTOMY  07/12/2015    Social History   Socioeconomic History   Marital status: Married    Spouse name: Not on file   Number of children: Not on file   Years of education: Not on file   Highest education level: Not on file  Occupational History   Not on file  Tobacco Use   Smoking status: Never   Smokeless tobacco: Never  Vaping Use   Vaping status: Never Used  Substance and Sexual Activity   Alcohol use: No    Alcohol/week: 0.0 standard drinks of alcohol   Drug use: No   Sexual activity: Not on file  Other Topics Concern   Not on file  Social History Narrative   Right Handed   1 Cup of Coffee Per Day   Social Drivers of Health   Financial Resource Strain: Not on file  Food Insecurity: Not on file  Transportation Needs: Not on file  Physical Activity: Not on file  Stress: Not on file  Social Connections: Not on file    Family History  Problem Relation Age of Onset   Heart attack Father 55   Stroke Mother 7   Hypertension Other    Arrhythmia Brother        PPM    Outpatient Encounter Medications as of 06/19/2023  Medication Sig   apixaban (ELIQUIS) 5 MG TABS tablet Take 1 tablet by mouth twice daily   Ascorbic Acid (VITAMIN C PO) Take 1,000 mg by mouth daily.   Calcium Carb-Cholecalciferol (OYSTER SHELL CALCIUM W/D) 500-5 MG-MCG TABS Take by mouth.   Coenzyme Q10 (CO Q-10 PO) Take 200 mg by mouth daily.    cyanocobalamin 1000 MCG tablet Take 1,000 mcg by mouth daily.   Garlic 1000 MG CAPS Take 1,000 mg by mouth daily.   hydrochlorothiazide (HYDRODIURIL) 12.5 MG tablet Take 12.5 mg by mouth daily.   L-LYSINE PO Take 500 mg by mouth daily.   lisinopril (ZESTRIL) 40 MG tablet Take 40 mg by mouth daily.   metoprolol tartrate (LOPRESSOR) 100 MG tablet Take 1 tablet by mouth twice daily   Misc Natural Products (BEET ROOT PO) Take by mouth daily.   Misc. Devices MISC by Does not apply route. Bipap   Multiple Vitamin (MULTIVITAMIN) tablet Take 1 tablet by mouth daily.   Omega-3 Fatty Acids (FISH OIL) 1000 MG CAPS Take 1 capsule by mouth 3 (three) times daily.    rosuvastatin (CRESTOR) 10 MG tablet Take 1 tablet by mouth once daily   triamcinolone cream (KENALOG) 0.1 % Apply 1 Application topically 2 (two) times daily.   Vitamin A 2400 MCG (8000 UT) CAPS Take 1 capsule by mouth daily.   VITAMIN D PO Take 50 mcg by mouth daily.   [DISCONTINUED] levothyroxine (SYNTHROID) 88 MCG tablet Take 1 tablet (88 mcg total) by mouth daily  before breakfast.   levothyroxine (SYNTHROID) 88 MCG tablet Take 1 tablet (88 mcg total) by mouth daily before breakfast.   ZINC OXIDE PO Take 50 mg by mouth daily in the afternoon.   No facility-administered encounter medications on file as of 06/19/2023.    ALLERGIES: Allergies  Allergen Reactions   Elemental Sulfur    Lipitor [Atorvastatin] Other (See Comments)   Penicillins     VACCINATION STATUS: Immunization History  Administered Date(s) Administered   Influenza-Unspecified 01/07/2020   Moderna Sars-Covid-2 Vaccination 05/21/2019, 06/18/2019, 02/10/2020     HPI  Douglas Lee is 78 y.o. male who returns for follow-up in the management of hypothyroidism with repeat thyroid function tests.  PMD: Ignatius Specking, MD.   During his prior visits he had subacute thyroiditis which resolved into partial hypothyroidism.  He continues to respond to levothyroxine  replacement, currently on 88 mcg p.o. daily before breakfast.   He reports consistency and compliance and returns with labs consistent with appropriate replacement.  He has no new complaints today.    he denies dysphagia, choking, shortness of breath, no recent voice change.    he denies family history of thyroid dysfunction nor any family history of thyroid cancer.  he denies personal history of goiter. he is not on any anti-thyroid medications nor on any thyroid hormone supplements.  Review of systems  Constitutional: + Minimally fluctuating body weight,  current Body mass index is 26.54 kg/m. , no fatigue, no subjective hyperthermia, no subjective hypothermia Eyes: no blurry vision, no xerophthalmia ENT: no sore throat, no nodules palpated in throat, no dysphagia/odynophagia, no hoarseness Cardiovascular: no chest pain, no shortness of breath, + intermittent palpitations (hx afib-sees cardiology), no leg swelling Respiratory: no cough, no shortness of breath Gastrointestinal: no nausea/vomiting/diarrhea Musculoskeletal: no muscle/joint aches Skin: no rashes, no hyperemia Neurological: no tremors, no numbness, no tingling, no dizziness Psychiatric: no depression, no anxiety   Objective:    BP 110/70 (BP Location: Right Arm, Patient Position: Sitting, Cuff Size: Large)   Pulse 62   Ht 5\' 10"  (1.778 m)   Wt 185 lb (83.9 kg)   BMI 26.54 kg/m   Wt Readings from Last 3 Encounters:  06/19/23 185 lb (83.9 kg)  03/02/23 187 lb 9.6 oz (85.1 kg)  12/18/22 188 lb 3.2 oz (85.4 kg)                          Physical Exam- Limited  Constitutional:  Body mass index is 26.54 kg/m. , not in acute distress, normal state of mind Eyes:  EOMI, no exophthalmos Neck: Supple Cardiovascular: irregular rhythm (hx afib), no murmurs, rubs, or gallops, no edema Musculoskeletal: no gross deformities, strength intact in all four extremities, no gross restriction of joint movements Skin:  no rashes, no  hyperemia Neurological: no tremor with outstretched hands    CMP     Component Value Date/Time   NA 140 11/30/2018 1441   K 4.5 11/30/2018 1441   CL 105 11/30/2018 1441   CO2 25 11/30/2018 1441   GLUCOSE 95 11/30/2018 1441   BUN 12 02/10/2020 0000   CREATININE 1.0 02/10/2020 0000   CREATININE 1.23 (H) 11/30/2018 1441   CALCIUM 9.9 11/30/2018 1441   GFRNONAA 70 02/10/2020 0000   GFRAA 81 02/10/2020 0000     CBC    Component Value Date/Time   WBC 11.2 (H) 11/30/2018 1441   RBC 4.28 11/30/2018 1441   HGB 13.8 11/30/2018 1441  HCT 41.5 11/30/2018 1441   PLT 305 11/30/2018 1441   MCV 97.0 11/30/2018 1441   MCH 32.2 11/30/2018 1441   MCHC 33.3 11/30/2018 1441   RDW 12.2 11/30/2018 1441    Lab Results  Component Value Date   TSH 1.14 06/08/2023   TSH 3.22 06/19/2021   TSH 1.18 12/17/2020   TSH 2.62 06/18/2020   TSH 7.29 (A) 05/02/2020   TSH 0.02 (A) 02/02/2020   FREET4 1.21 06/08/2023     Latest Reference Range & Units 02/02/20 00:00 05/02/20 00:00 06/18/20 00:00 12/17/20 00:00 06/19/21 00:00 06/08/23 13:03  TSH 0.41 - 5.90  0.02 ! (E) 7.29 ! (E) 2.62 (E) 1.18 (E) 3.22 (E) 1.14 (E)  T4,Free(Direct) ng/dL      1.61 (E)  !: Data is abnormal (E): External lab result  Assessment & Plan:   1.  Hypothyroidism due to Hashimoto's thyroiditis  His previsit TFTs are consistent with appropriate hormone replacement.  He is advised to continue Levothyroxine 88 mcg po daily before breakfast.  Will recheck labs prior to next visit and adjust dose accordingly.   - We discussed about the correct intake of his thyroid hormone, on empty stomach at fasting, with water, separated by at least 30 minutes from breakfast and other medications,  and separated by more than 4 hours from calcium, iron, multivitamins, acid reflux medications (PPIs). -Patient is made aware of the fact that thyroid hormone replacement is needed for life, dose to be adjusted by periodic monitoring of thyroid  function tests.     -Patient is advised to maintain close follow up with Ignatius Specking, MD for primary care needs.    I spent  28  minutes in the care of the patient today including review of labs from Thyroid Function, CMP, and other relevant labs ; imaging/biopsy records (current and previous including abstractions from other facilities); face-to-face time discussing  his lab results and symptoms, medications doses, his options of short and long term treatment based on the latest standards of care / guidelines;   and documenting the encounter.  Aggie Moats  participated in the discussions, expressed understanding, and voiced agreement with the above plans.  All questions were answered to his satisfaction. he is encouraged to contact clinic should he have any questions or concerns prior to his return visit.     Follow up plan: Return in about 6 months (around 12/20/2023) for Thyroid follow up, Previsit labs.   Thank you for involving me in the care of this pleasant patient, and I will continue to update you with his progress.  Ronny Bacon, [facility] [address] [place], [place] [address] Phone: [phone] Fax: [phone]

## 2023-06-23 DIAGNOSIS — N1831 Chronic kidney disease, stage 3a: Secondary | ICD-10-CM | POA: Diagnosis not present

## 2023-06-23 DIAGNOSIS — I4891 Unspecified atrial fibrillation: Secondary | ICD-10-CM | POA: Diagnosis not present

## 2023-06-23 DIAGNOSIS — Z299 Encounter for prophylactic measures, unspecified: Secondary | ICD-10-CM | POA: Diagnosis not present

## 2023-06-23 DIAGNOSIS — D6869 Other thrombophilia: Secondary | ICD-10-CM | POA: Diagnosis not present

## 2023-06-23 DIAGNOSIS — I10 Essential (primary) hypertension: Secondary | ICD-10-CM | POA: Diagnosis not present

## 2023-08-09 DIAGNOSIS — E663 Overweight: Secondary | ICD-10-CM | POA: Diagnosis not present

## 2023-08-09 DIAGNOSIS — R030 Elevated blood-pressure reading, without diagnosis of hypertension: Secondary | ICD-10-CM | POA: Diagnosis not present

## 2023-08-09 DIAGNOSIS — Z6826 Body mass index (BMI) 26.0-26.9, adult: Secondary | ICD-10-CM | POA: Diagnosis not present

## 2023-08-09 DIAGNOSIS — M1000 Idiopathic gout, unspecified site: Secondary | ICD-10-CM | POA: Diagnosis not present

## 2023-08-12 DIAGNOSIS — I482 Chronic atrial fibrillation, unspecified: Secondary | ICD-10-CM | POA: Diagnosis not present

## 2023-08-12 DIAGNOSIS — I10 Essential (primary) hypertension: Secondary | ICD-10-CM | POA: Diagnosis not present

## 2023-08-12 DIAGNOSIS — N1831 Chronic kidney disease, stage 3a: Secondary | ICD-10-CM | POA: Diagnosis not present

## 2023-08-12 DIAGNOSIS — M109 Gout, unspecified: Secondary | ICD-10-CM | POA: Diagnosis not present

## 2023-08-12 DIAGNOSIS — Z299 Encounter for prophylactic measures, unspecified: Secondary | ICD-10-CM | POA: Diagnosis not present

## 2023-08-25 NOTE — Patient Instructions (Incomplete)
 Please continue using your ASV regularly. While your insurance requires that you use ASV at least 4 hours each night on 70% of the nights, I recommend, that you not skip any nights and use it throughout the night if you can. Getting used to ASV and staying with the treatment long term does take time and patience and discipline. Untreated obstructive sleep apnea when it is moderate to severe can have an adverse impact on cardiovascular health and raise her risk for heart disease, arrhythmias, hypertension, congestive heart failure, stroke and diabetes. Untreated obstructive sleep apnea causes sleep disruption, nonrestorative sleep, and sleep deprivation. This can have an impact on your day to day functioning and cause daytime sleepiness and impairment of cognitive function, memory loss, mood disturbance, and problems focussing. Using ASV regularly can improve these symptoms.  We will update supply orders, today. You are eligible for a new machine. I will order a repeat sleep study that you will do at home. Please listen out for a call from our sleep lab staff to schedule. Once you complete study, our sleep doctors with evaluate the data and we will use that to order a new machine as indicated. This process could take a few weeks. Once new machine is ordered, you will hear back from your DME company to schedule set up of your new machine.   We will need to see you back within 61-90 days following set up of your new BiPAP to document compliance as required by your insurance company. Please call the office to schedule follow up when you receive your new machine. Please feel free to reach out with any questions or concerns.

## 2023-08-25 NOTE — Progress Notes (Deleted)
 PATIENT: Douglas Lee DOB: 1946-03-05  REASON FOR VISIT: follow up HISTORY FROM: patient  No chief complaint on file.    HISTORY OF PRESENT ILLNESS:  08/25/23 ALL:  Douglas Lee returns for follow up for complex sleep apnea on ASV. He continues to do well. He is using ASV nightly for about 7 hours.     08/25/2022 ALL: Douglas Lee returns for follow up for complex sleep apnea on ASV. He continues to do well. He is using ASV nightly for about 7 hours. He reports doing excellent on therapy. He denies concerns with machine or supplies. Cardiology has asked he sleep on his right side. Mask seems to leak more when on his right side. He has not changes mask recently.     08/19/2021 ALL: Douglas Lee returns for follow up for CSA on ASV. He continues to do well on therapy. He is using ASV every night. He feels well rested when using ASV and reports sleeping "great". He has noticed more of a leak over the past few months. His cardiologist recommends he sleeps on his left side for atrial fib. He knows that his apnea was worse on his side with original testing (non supine AHI 8.7). He also reports not changing his mask. He is not sure he has ever changed out his mask.     08/13/2020 ALL:  Douglas Lee returns for folow up for central sleep apnea managed with ASV. He continues to do very well with therapy. He is using ASV nightly. He is sleeping well. He is using a FFM. No leaks.     08/10/2019 ALL:  Douglas Lee is a 78 y.o. male here today for follow up of central sleep apnea on ASV therapy.  He continues to note significant improvements in how he feels.  He wakes in the morning full of energy.  He is very happy using ASV therapy.  He denies any concerns with his machine or supplies.  Compliance report dated 07/10/2019 through 08/08/2019 reveals that he used ASV therapy 30 of the past 30 days for compliance of 100%.  He used ASV greater than 4 hours all 30 days for compliance of 100%.  Average usage was 7 hours  and 42 minutes.  Residual AHI is 3.5 with a EPAP of 5 cm of water minimum pressure support of 3 cm of water and maximum pressure support of 15 cm of water.  There was no leak noted.   HISTORY: (copied from my note on 02/09/2019)  Douglas Lee is a 78 y.o. male here today for follow up for central sleep apnea on ASV. He was previously treated with CPAP therapy but continued to have apneic events and felt that he was not getting enough air with CPAP. Compliance report reviewed and showed central apnea >19/hr. He was switched to ASV following titration study. He reports that he is feeing fantastic. He notes a significant improvement in sleep quality and increased energy levels. He denies any concerns with ASV.    Compliance report dated 01/08/2019 through 02/06/2019 reveals that he is using ASV nightly for compliance 100%.  He is using ASV greater than 4 hours every night for compliance of 100%.  Average usage was 7 hours and 54 minutes.  AHI is now 5.8 with EPAP of 5 cm of water, minimum pressure support of 3 cm of water and maximum pressure support of 15 cm of water.  There is no significant leak noted.   HISTORY: (copied from my note on 10/05/2018)  Douglas Lee is a 78 y.o. male here today for follow up for OSA on CPAP.  He reports that he has been using his CPAP machine every night.  He is frustrated as his mask does not fit well.  He continues to note a significant leak when using his mask.  He has reached out to [place] who was very helpful, however, they were unable to test the fit due to the pandemic.  He has a mustache and feels that this is inhibiting a good seal.  He does report benefit from using the machine.  He feels that he is resting a little better.  AHI remains elevated and significant leak is noted on the following download report.     09/05/2018 - 10/04/2018   Compliance Report Usage 09/05/2018 - 10/04/2018 Usage days 29/30 days (97%) >= 4 hours 18 days (60%) < 4  hours 11 days (37%) Usage hours 131 hours 56 minutes Average usage (total days) 4 hours 24 minutes Average usage (days used) 4 hours 33 minutes Median usage (days used) 4 hours 53 minutes Total used hours (value since last reset - 10/04/2018) 142 hours AirSense 10 AutoSet Serial number [id number] Mode AutoSet Min Pressure 5 cmH2O Max Pressure 16 cmH2O EPR Fulltime EPR level 2 Response Standard Therapy Pressure - cmH2O Median: 6.3 95th percentile: 8.8 Maximum: 9.8 Leaks - L/min Median: 28.4 95th percentile: 49.6 Maximum: 61.1 Events per hour AI: 30.3 HI: 0.4 AHI: 30.7 Apnea Index Central: 7.9 Obstructive: 1.8 Unknown: 20.5 RERA Index 0.6 Cheyne-Stokes respiration (average duration per night) 1 hours 7 minutes (21%)   REVIEW OF SYSTEMS: Out of a complete 14 system review of symptoms, the patient complains only of the following symptoms, none and all other reviewed systems are negative.  ESS: 0/24   ALLERGIES: Allergies  Allergen Reactions   Elemental Sulfur    Lipitor [Atorvastatin] Other (See Comments)   Penicillins     HOME MEDICATIONS: Outpatient Medications Prior to Visit  Medication Sig Dispense Refill   apixaban  (ELIQUIS ) 5 MG TABS tablet Take 1 tablet by mouth twice daily 60 tablet 11   Ascorbic Acid (VITAMIN C PO) Take 1,000 mg by mouth daily.     Calcium  Carb-Cholecalciferol (OYSTER SHELL CALCIUM  W/D) 500-5 MG-MCG TABS Take by mouth.     Coenzyme Q10 (CO Q-10 PO) Take 200 mg by mouth daily.     cyanocobalamin 1000 MCG tablet Take 1,000 mcg by mouth daily.     Garlic 1000 MG CAPS Take 1,000 mg by mouth daily.     hydrochlorothiazide (HYDRODIURIL) 12.5 MG tablet Take 12.5 mg by mouth daily.     L-LYSINE PO Take 500 mg by mouth daily.     levothyroxine  (SYNTHROID ) 88 MCG tablet Take 1 tablet (88 mcg total) by mouth daily before breakfast. 90 tablet 3   lisinopril  (ZESTRIL ) 40 MG tablet Take 40 mg by mouth daily.     metoprolol  tartrate (LOPRESSOR ) 100 MG tablet  Take 1 tablet by mouth twice daily 180 tablet 2   Misc Natural Products (BEET ROOT PO) Take by mouth daily.     Misc. Devices MISC by Does not apply route. Bipap     Multiple Vitamin (MULTIVITAMIN) tablet Take 1 tablet by mouth daily.     Omega-3 Fatty Acids (FISH OIL) 1000 MG CAPS Take 1 capsule by mouth 3 (three) times daily.      rosuvastatin  (CRESTOR ) 10 MG tablet Take 1 tablet by mouth once daily 90 tablet 2  triamcinolone  cream (KENALOG ) 0.1 % Apply 1 Application topically 2 (two) times daily. 60 g 0   Vitamin A 2400 MCG (8000 UT) CAPS Take 1 capsule by mouth daily.     VITAMIN D PO Take 50 mcg by mouth daily.     ZINC OXIDE PO Take 50 mg by mouth daily in the afternoon.     No facility-administered medications prior to visit.    PAST MEDICAL HISTORY: Past Medical History:  Diagnosis Date   Atrial fibrillation (HCC)    BiPAP (biphasic positive airway pressure) dependence    COVID-19    Essential hypertension    History of stroke 10/2017   Embolic, right parietal lobe and insular region   Hyperlipemia     PAST SURGICAL HISTORY: Past Surgical History:  Procedure Laterality Date   COLONOSCOPY     EYE SURGERY  10/2022   KNEE ARTHROTOMY Right    MINOR HEMORRHOIDECTOMY  07/12/2015    FAMILY HISTORY: Family History  Problem Relation Age of Onset   Heart attack Father 46   Stroke Mother 80   Hypertension Other    Arrhythmia Brother        PPM    SOCIAL HISTORY: Social History   Socioeconomic History   Marital status: Married    Spouse name: Not on file   Number of children: Not on file   Years of education: Not on file   Highest education level: Not on file  Occupational History   Not on file  Tobacco Use   Smoking status: Never   Smokeless tobacco: Never  Vaping Use   Vaping status: Never Used  Substance and Sexual Activity   Alcohol  use: No    Alcohol /week: 0.0 standard drinks of alcohol    Drug use: No   Sexual activity: Not on file  Other Topics  Concern   Not on file  Social History Narrative   Right Handed   1 Cup of Coffee Per Day   Social Drivers of Health   Financial Resource Strain: Not on file  Food Insecurity: Not on file  Transportation Needs: Not on file  Physical Activity: Not on file  Stress: Not on file  Social Connections: Not on file  Intimate Partner Violence: Not on file      PHYSICAL EXAM  There were no vitals filed for this visit.    There is no height or weight on file to calculate BMI.  Generalized: Well developed, in no acute distress  Cardiology: normal rate and rhythm, no murmur noted Respiratory: clear to auscultation bilaterally  Neurological examination  Mentation: Alert oriented to time, place, history taking. Follows all commands speech and language fluent Cranial nerve II-XII: Pupils were equal round reactive to light. Extraocular movements were full, visual field were full for the year Motor: The motor testing reveals 5 over 5 strength of all 4 extremities. Good symmetric motor tone is noted throughout.   Gait and station: Gait is normal.    DIAGNOSTIC DATA (LABS, IMAGING, TESTING) - I reviewed patient records, labs, notes, testing and imaging myself where available.      No data to display           Lab Results  Component Value Date   WBC 11.2 (H) 11/30/2018   HGB 13.8 11/30/2018   HCT 41.5 11/30/2018   MCV 97.0 11/30/2018   PLT 305 11/30/2018      Component Value Date/Time   NA 140 11/30/2018 1441   K 4.5 11/30/2018 1441  CL 105 11/30/2018 1441   CO2 25 11/30/2018 1441   GLUCOSE 95 11/30/2018 1441   BUN 12 02/10/2020 0000   CREATININE 1.0 02/10/2020 0000   CREATININE 1.23 (H) 11/30/2018 1441   CALCIUM  9.9 11/30/2018 1441   GFRNONAA 70 02/10/2020 0000   GFRAA 81 02/10/2020 0000   No results found for: "CHOL", "HDL", "LDLCALC", "LDLDIRECT", "TRIG", "CHOLHDL" No results found for: "HGBA1C" No results found for: "VITAMINB12" Lab Results  Component Value  Date   TSH 1.14 06/08/2023     ASSESSMENT AND PLAN 78 y.o. year old male  has a past medical history of Atrial fibrillation (HCC), BiPAP (biphasic positive airway pressure) dependence, COVID-19, Essential hypertension, History of stroke (10/2017), and Hyperlipemia. here with   No diagnosis found.   Douglas Lee is doing fantastic on ASV therapy. Compliance report reveals excellent compliance. I will send transfer of care for new DME. He was encouraged to change supplies regularly. He will continue close follow-up with primary care and cardiology.  Healthy lifestyle habits encouraged.  He will return for follow-up in 1 year, sooner if needed.  He verbalizes understanding and agreement with this plan.   No orders of the defined types were placed in this encounter.    No orders of the defined types were placed in this encounter.        Terrilyn Fick, FNP-C 08/25/2023, 1:07 PM [facility] [address] [place], [place] [address] [phone]

## 2023-08-26 ENCOUNTER — Telehealth: Payer: Self-pay | Admitting: Family Medicine

## 2023-08-26 NOTE — Progress Notes (Deleted)
 Douglas Lee

## 2023-08-26 NOTE — Telephone Encounter (Signed)
 The patient states he has been calling several times to schedule his sleep study. I see one that was ordered yesterday but it didn't drop into my que. Can you reorder it? Thank you.

## 2023-08-26 NOTE — Telephone Encounter (Signed)
 Once he sees Amy tomorrow for yearly f/u, she will sign orders for him to get scheduled for HST

## 2023-08-26 NOTE — Telephone Encounter (Signed)
 Perfect thank you!

## 2023-08-27 ENCOUNTER — Ambulatory Visit: Payer: PPO | Admitting: Family Medicine

## 2023-09-02 ENCOUNTER — Telehealth: Payer: Self-pay | Admitting: Family Medicine

## 2023-09-02 NOTE — Progress Notes (Unsigned)
 PATIENT: Douglas Lee DOB: 08-01-1945  REASON FOR VISIT: follow up HISTORY FROM: patient  No chief complaint on file.    HISTORY OF PRESENT ILLNESS:  09/02/23 ALL:  Douglas Lee returns for follow up for complex sleep apnea on ASV. He continues to do well. He is using ASV nightly for about 7 hours.     08/25/2022 ALL: Douglas Lee returns for follow up for complex sleep apnea on ASV. He continues to do well. He is using ASV nightly for about 7 hours. He reports doing excellent on therapy. He denies concerns with machine or supplies. Cardiology has asked he sleep on his right side. Mask seems to leak more when on his right side. He has not changes mask recently.     08/19/2021 ALL: Douglas Lee returns for follow up for CSA on ASV. He continues to do well on therapy. He is using ASV every night. He feels well rested when using ASV and reports sleeping "great". He has noticed more of a leak over the past few months. His cardiologist recommends he sleeps on his left side for atrial fib. He knows that his apnea was worse on his side with original testing (non supine AHI 8.7). He also reports not changing his mask. He is not sure he has ever changed out his mask.     08/13/2020 ALL:  Douglas Lee returns for folow up for central sleep apnea managed with ASV. He continues to do very well with therapy. He is using ASV nightly. He is sleeping well. He is using a FFM. No leaks.     08/10/2019 ALL:  Douglas Lee is a 78 y.o. male here today for follow up of central sleep apnea on ASV therapy.  He continues to note significant improvements in how he feels.  He wakes in the morning full of energy.  He is very happy using ASV therapy.  He denies any concerns with his machine or supplies.  Compliance report dated 07/10/2019 through 08/08/2019 reveals that he used ASV therapy 30 of the past 30 days for compliance of 100%.  He used ASV greater than 4 hours all 30 days for compliance of 100%.  Average usage was 7 hours  and 42 minutes.  Residual AHI is 3.5 with a EPAP of 5 cm of water minimum pressure support of 3 cm of water and maximum pressure support of 15 cm of water.  There was no leak noted.   HISTORY: (copied from my note on 02/09/2019)  Douglas Lee is a 78 y.o. male here today for follow up for central sleep apnea on ASV. He was previously treated with CPAP therapy but continued to have apneic events and felt that he was not getting enough air with CPAP. Compliance report reviewed and showed central apnea >19/hr. He was switched to ASV following titration study. He reports that he is feeing fantastic. He notes a significant improvement in sleep quality and increased energy levels. He denies any concerns with ASV.    Compliance report dated 01/08/2019 through 02/06/2019 reveals that he is using ASV nightly for compliance 100%.  He is using ASV greater than 4 hours every night for compliance of 100%.  Average usage was 7 hours and 54 minutes.  AHI is now 5.8 with EPAP of 5 cm of water, minimum pressure support of 3 cm of water and maximum pressure support of 15 cm of water.  There is no significant leak noted.   HISTORY: (copied from my note on 10/05/2018)  Douglas Lee is a 78 y.o. male here today for follow up for OSA on CPAP.  He reports that he has been using his CPAP machine every night.  He is frustrated as his mask does not fit well.  He continues to note a significant leak when using his mask.  He has reached out to [place] who was very helpful, however, they were unable to test the fit due to the pandemic.  He has a mustache and feels that this is inhibiting a good seal.  He does report benefit from using the machine.  He feels that he is resting a little better.  AHI remains elevated and significant leak is noted on the following download report.     09/05/2018 - 10/04/2018   Compliance Report Usage 09/05/2018 - 10/04/2018 Usage days 29/30 days (97%) >= 4 hours 18 days (60%) < 4  hours 11 days (37%) Usage hours 131 hours 56 minutes Average usage (total days) 4 hours 24 minutes Average usage (days used) 4 hours 33 minutes Median usage (days used) 4 hours 53 minutes Total used hours (value since last reset - 10/04/2018) 142 hours AirSense 10 AutoSet Serial number [id number] Mode AutoSet Min Pressure 5 cmH2O Max Pressure 16 cmH2O EPR Fulltime EPR level 2 Response Standard Therapy Pressure - cmH2O Median: 6.3 95th percentile: 8.8 Maximum: 9.8 Leaks - L/min Median: 28.4 95th percentile: 49.6 Maximum: 61.1 Events per hour AI: 30.3 HI: 0.4 AHI: 30.7 Apnea Index Central: 7.9 Obstructive: 1.8 Unknown: 20.5 RERA Index 0.6 Cheyne-Stokes respiration (average duration per night) 1 hours 7 minutes (21%)   REVIEW OF SYSTEMS: Out of a complete 14 system review of symptoms, the patient complains only of the following symptoms, none and all other reviewed systems are negative.  ESS: 0/24   ALLERGIES: Allergies  Allergen Reactions   Elemental Sulfur    Lipitor [Atorvastatin] Other (See Comments)   Penicillins     HOME MEDICATIONS: Outpatient Medications Prior to Visit  Medication Sig Dispense Refill   apixaban  (ELIQUIS ) 5 MG TABS tablet Take 1 tablet by mouth twice daily 60 tablet 11   Ascorbic Acid (VITAMIN C PO) Take 1,000 mg by mouth daily.     Calcium  Carb-Cholecalciferol (OYSTER SHELL CALCIUM  W/D) 500-5 MG-MCG TABS Take by mouth.     Coenzyme Q10 (CO Q-10 PO) Take 200 mg by mouth daily.     cyanocobalamin 1000 MCG tablet Take 1,000 mcg by mouth daily.     Garlic 1000 MG CAPS Take 1,000 mg by mouth daily.     hydrochlorothiazide (HYDRODIURIL) 12.5 MG tablet Take 12.5 mg by mouth daily.     L-LYSINE PO Take 500 mg by mouth daily.     levothyroxine  (SYNTHROID ) 88 MCG tablet Take 1 tablet (88 mcg total) by mouth daily before breakfast. 90 tablet 3   lisinopril  (ZESTRIL ) 40 MG tablet Take 40 mg by mouth daily.     metoprolol  tartrate (LOPRESSOR ) 100 MG tablet  Take 1 tablet by mouth twice daily 180 tablet 2   Misc Natural Products (BEET ROOT PO) Take by mouth daily.     Misc. Devices MISC by Does not apply route. Bipap     Multiple Vitamin (MULTIVITAMIN) tablet Take 1 tablet by mouth daily.     Omega-3 Fatty Acids (FISH OIL) 1000 MG CAPS Take 1 capsule by mouth 3 (three) times daily.      rosuvastatin  (CRESTOR ) 10 MG tablet Take 1 tablet by mouth once daily 90 tablet 2  triamcinolone  cream (KENALOG ) 0.1 % Apply 1 Application topically 2 (two) times daily. 60 g 0   Vitamin A 2400 MCG (8000 UT) CAPS Take 1 capsule by mouth daily.     VITAMIN D PO Take 50 mcg by mouth daily.     ZINC OXIDE PO Take 50 mg by mouth daily in the afternoon.     No facility-administered medications prior to visit.    PAST MEDICAL HISTORY: Past Medical History:  Diagnosis Date   Atrial fibrillation (HCC)    BiPAP (biphasic positive airway pressure) dependence    COVID-19    Essential hypertension    History of stroke 10/2017   Embolic, right parietal lobe and insular region   Hyperlipemia     PAST SURGICAL HISTORY: Past Surgical History:  Procedure Laterality Date   COLONOSCOPY     EYE SURGERY  10/2022   KNEE ARTHROTOMY Right    MINOR HEMORRHOIDECTOMY  07/12/2015    FAMILY HISTORY: Family History  Problem Relation Age of Onset   Heart attack Father 26   Stroke Mother 42   Hypertension Other    Arrhythmia Brother        PPM    SOCIAL HISTORY: Social History   Socioeconomic History   Marital status: Married    Spouse name: Not on file   Number of children: Not on file   Years of education: Not on file   Highest education level: Not on file  Occupational History   Not on file  Tobacco Use   Smoking status: Never   Smokeless tobacco: Never  Vaping Use   Vaping status: Never Used  Substance and Sexual Activity   Alcohol  use: No    Alcohol /week: 0.0 standard drinks of alcohol    Drug use: No   Sexual activity: Not on file  Other Topics  Concern   Not on file  Social History Narrative   Right Handed   1 Cup of Coffee Per Day   Social Drivers of Health   Financial Resource Strain: Not on file  Food Insecurity: Not on file  Transportation Needs: Not on file  Physical Activity: Not on file  Stress: Not on file  Social Connections: Not on file  Intimate Partner Violence: Not on file      PHYSICAL EXAM  There were no vitals filed for this visit.    There is no height or weight on file to calculate BMI.  Generalized: Well developed, in no acute distress  Cardiology: normal rate and rhythm, no murmur noted Respiratory: clear to auscultation bilaterally  Neurological examination  Mentation: Alert oriented to time, place, history taking. Follows all commands speech and language fluent Cranial nerve II-XII: Pupils were equal round reactive to light. Extraocular movements were full, visual field were full for the year Motor: The motor testing reveals 5 over 5 strength of all 4 extremities. Good symmetric motor tone is noted throughout.   Gait and station: Gait is normal.    DIAGNOSTIC DATA (LABS, IMAGING, TESTING) - I reviewed patient records, labs, notes, testing and imaging myself where available.      No data to display           Lab Results  Component Value Date   WBC 11.2 (H) 11/30/2018   HGB 13.8 11/30/2018   HCT 41.5 11/30/2018   MCV 97.0 11/30/2018   PLT 305 11/30/2018      Component Value Date/Time   NA 140 11/30/2018 1441   K 4.5 11/30/2018 1441  CL 105 11/30/2018 1441   CO2 25 11/30/2018 1441   GLUCOSE 95 11/30/2018 1441   BUN 12 02/10/2020 0000   CREATININE 1.0 02/10/2020 0000   CREATININE 1.23 (H) 11/30/2018 1441   CALCIUM  9.9 11/30/2018 1441   GFRNONAA 70 02/10/2020 0000   GFRAA 81 02/10/2020 0000   No results found for: "CHOL", "HDL", "LDLCALC", "LDLDIRECT", "TRIG", "CHOLHDL" No results found for: "HGBA1C" No results found for: "VITAMINB12" Lab Results  Component Value  Date   TSH 1.14 06/08/2023     ASSESSMENT AND PLAN 78 y.o. year old male  has a past medical history of Atrial fibrillation (HCC), BiPAP (biphasic positive airway pressure) dependence, COVID-19, Essential hypertension, History of stroke (10/2017), and Hyperlipemia. here with     ICD-10-CM   1. Complex sleep apnea syndrome  G47.39     2. Encounter for counseling on adaptive servo-ventilation (ASV) use  Z71.89       Douglas Lee is doing fantastic on ASV therapy. Compliance report reveals excellent compliance. He is eligible for a new machine. HST ordered. He was encouraged to change supplies regularly. He will continue close follow-up with primary care and cardiology. Healthy lifestyle habits encouraged.  He will return for follow-up in 61-90 days following set up of new machine.  He verbalizes understanding and agreement with this plan.   No orders of the defined types were placed in this encounter.    No orders of the defined types were placed in this encounter.        Terrilyn Fick, FNP-C 09/02/2023, 11:12 AM [facility] [address] [place], [place] [address] [phone]

## 2023-09-02 NOTE — Patient Instructions (Incomplete)
Please continue using your ASV regularly. While your insurance requires that you use ASV at least 4 hours each night on 70% of the nights, I recommend, that you not skip any nights and use it throughout the night if you can. Getting used to ASV and staying with the treatment long term does take time and patience and discipline. Untreated obstructive sleep apnea when it is moderate to severe can have an adverse impact on cardiovascular health and raise her risk for heart disease, arrhythmias, hypertension, congestive heart failure, stroke and diabetes. Untreated obstructive sleep apnea causes sleep disruption, nonrestorative sleep, and sleep deprivation. This can have an impact on your day to day functioning and cause daytime sleepiness and impairment of cognitive function, memory loss, mood disturbance, and problems focussing. Using ASV regularly can improve these symptoms.  We will update supply orders, today.    Follow up in 1 year

## 2023-09-02 NOTE — Telephone Encounter (Signed)
 Can you guys see if he can come tomorrow for one of my open slots to update notes? We need to have visit before we can schedule sleep study and get new machine. TY!

## 2023-09-02 NOTE — Telephone Encounter (Signed)
 Spoke with patient and scheduled appointment with Douglas Lee for 09/03/23 at 9am

## 2023-09-02 NOTE — Progress Notes (Unsigned)
 Douglas Lee

## 2023-09-03 ENCOUNTER — Ambulatory Visit: Admitting: Family Medicine

## 2023-09-03 ENCOUNTER — Encounter: Payer: Self-pay | Admitting: Family Medicine

## 2023-09-03 VITALS — BP 140/78 | Ht 70.0 in

## 2023-09-03 DIAGNOSIS — G4739 Other sleep apnea: Secondary | ICD-10-CM | POA: Diagnosis not present

## 2023-09-03 DIAGNOSIS — Z7189 Other specified counseling: Secondary | ICD-10-CM

## 2023-09-07 DIAGNOSIS — H35372 Puckering of macula, left eye: Secondary | ICD-10-CM | POA: Diagnosis not present

## 2023-10-22 DIAGNOSIS — N1831 Chronic kidney disease, stage 3a: Secondary | ICD-10-CM | POA: Diagnosis not present

## 2023-10-22 DIAGNOSIS — Z299 Encounter for prophylactic measures, unspecified: Secondary | ICD-10-CM | POA: Diagnosis not present

## 2023-10-22 DIAGNOSIS — I10 Essential (primary) hypertension: Secondary | ICD-10-CM | POA: Diagnosis not present

## 2023-10-22 DIAGNOSIS — I482 Chronic atrial fibrillation, unspecified: Secondary | ICD-10-CM | POA: Diagnosis not present

## 2023-11-03 DIAGNOSIS — L573 Poikiloderma of Civatte: Secondary | ICD-10-CM | POA: Diagnosis not present

## 2023-11-03 DIAGNOSIS — D1730 Benign lipomatous neoplasm of skin and subcutaneous tissue of unspecified sites: Secondary | ICD-10-CM | POA: Diagnosis not present

## 2023-11-03 DIAGNOSIS — L570 Actinic keratosis: Secondary | ICD-10-CM | POA: Diagnosis not present

## 2023-11-03 DIAGNOSIS — Z85828 Personal history of other malignant neoplasm of skin: Secondary | ICD-10-CM | POA: Diagnosis not present

## 2023-11-17 DIAGNOSIS — Z299 Encounter for prophylactic measures, unspecified: Secondary | ICD-10-CM | POA: Diagnosis not present

## 2023-11-17 DIAGNOSIS — I10 Essential (primary) hypertension: Secondary | ICD-10-CM | POA: Diagnosis not present

## 2023-11-17 DIAGNOSIS — R21 Rash and other nonspecific skin eruption: Secondary | ICD-10-CM | POA: Diagnosis not present

## 2023-12-04 DIAGNOSIS — E063 Autoimmune thyroiditis: Secondary | ICD-10-CM | POA: Diagnosis not present

## 2023-12-04 LAB — LAB REPORT - SCANNED
Free T4: 1.1 ng/dL
TSH: 1.17 (ref 0.41–5.90)

## 2023-12-10 ENCOUNTER — Other Ambulatory Visit: Payer: Self-pay | Admitting: *Deleted

## 2023-12-10 DIAGNOSIS — E063 Autoimmune thyroiditis: Secondary | ICD-10-CM

## 2023-12-10 MED ORDER — LEVOTHYROXINE SODIUM 88 MCG PO TABS: 88.0000 ug | ORAL_TABLET | Freq: Every day | ORAL | 0 refills | Status: DC

## 2023-12-14 ENCOUNTER — Other Ambulatory Visit: Payer: Self-pay | Admitting: Pharmacist

## 2023-12-14 DIAGNOSIS — I480 Paroxysmal atrial fibrillation: Secondary | ICD-10-CM

## 2023-12-14 MED ORDER — APIXABAN 5 MG PO TABS: 5.0000 mg | ORAL_TABLET | Freq: Two times a day (BID) | ORAL | 0 refills | Status: DC

## 2023-12-22 NOTE — Patient Instructions (Signed)

## 2023-12-23 ENCOUNTER — Ambulatory Visit: Admitting: Nurse Practitioner

## 2023-12-23 ENCOUNTER — Encounter: Payer: Self-pay | Admitting: Nurse Practitioner

## 2023-12-23 VITALS — BP 110/80 | HR 65 | Ht 70.0 in | Wt 186.0 lb

## 2023-12-23 DIAGNOSIS — E063 Autoimmune thyroiditis: Secondary | ICD-10-CM

## 2023-12-23 MED ORDER — LEVOTHYROXINE SODIUM 88 MCG PO TABS: 88.0000 ug | ORAL_TABLET | Freq: Every day | ORAL | 3 refills | Status: AC

## 2023-12-23 NOTE — Progress Notes (Signed)
 12/23/2023    Endocrinology follow-up note   Subjective:    Patient ID: Douglas Lee, male    DOB: February 20, 1946, PCP Rosamond Leta NOVAK, MD.   Past Medical History:  Diagnosis Date   Atrial fibrillation (HCC)    BiPAP (biphasic positive airway pressure) dependence    COVID-19    Essential hypertension    History of stroke 10/2017   Embolic, right parietal lobe and insular region   Hyperlipemia     Past Surgical History:  Procedure Laterality Date   COLONOSCOPY     EYE SURGERY  10/2022   KNEE ARTHROTOMY Right    MINOR HEMORRHOIDECTOMY  07/12/2015    Social History   Socioeconomic History   Marital status: Married    Spouse name: Not on file   Number of children: Not on file   Years of education: Not on file   Highest education level: Not on file  Occupational History   Not on file  Tobacco Use   Smoking status: Never   Smokeless tobacco: Never  Vaping Use   Vaping status: Never Used  Substance and Sexual Activity   Alcohol  use: No    Alcohol /week: 0.0 standard drinks of alcohol    Drug use: No   Sexual activity: Not on file  Other Topics Concern   Not on file  Social History Narrative   Right Handed   1 Cup of Coffee Per Day   Social Drivers of Health   Financial Resource Strain: Not on file  Food Insecurity: Not on file  Transportation Needs: Not on file  Physical Activity: Not on file  Stress: Not on file  Social Connections: Not on file    Family History  Problem Relation Age of Onset   Heart attack Father 13   Stroke Mother 39   Hypertension Other    Arrhythmia Brother        PPM    Outpatient Encounter Medications as of 12/23/2023  Medication Sig   apixaban  (ELIQUIS ) 5 MG TABS tablet Take 1 tablet (5 mg total) by mouth 2 (two) times daily.   Ascorbic Acid (VITAMIN C PO) Take 1,000 mg by mouth daily.   Calcium  Carb-Cholecalciferol (OYSTER SHELL CALCIUM  W/D) 500-5 MG-MCG TABS Take by mouth.   Coenzyme Q10 (CO Q-10 PO) Take 200 mg by  mouth daily.   cyanocobalamin 1000 MCG tablet Take 1,000 mcg by mouth daily.   Garlic 1000 MG CAPS Take 1,000 mg by mouth daily.   hydrochlorothiazide (HYDRODIURIL) 12.5 MG tablet Take 12.5 mg by mouth daily.   L-LYSINE PO Take 500 mg by mouth daily.   lisinopril  (ZESTRIL ) 40 MG tablet Take 40 mg by mouth daily.   metoprolol  tartrate (LOPRESSOR ) 100 MG tablet Take 1 tablet by mouth twice daily   Misc Natural Products (BEET ROOT PO) Take by mouth daily.   Misc. Devices MISC by Does not apply route. Bipap   Multiple Vitamin (MULTIVITAMIN) tablet Take 1 tablet by mouth daily.   Omega-3 Fatty Acids (FISH OIL) 1000 MG CAPS Take 1 capsule by mouth 3 (three) times daily.    rosuvastatin  (CRESTOR ) 10 MG tablet Take 1 tablet by mouth once daily   triamcinolone  cream (KENALOG ) 0.1 % Apply 1 Application topically 2 (two) times daily.   Vitamin A 2400 MCG (8000 UT) CAPS Take 1 capsule by mouth daily.   VITAMIN D PO Take 50 mcg by mouth daily.   ZINC OXIDE PO Take 50 mg by mouth daily in  the afternoon.   [DISCONTINUED] levothyroxine  (SYNTHROID ) 88 MCG tablet Take 1 tablet (88 mcg total) by mouth daily before breakfast.   levothyroxine  (SYNTHROID ) 88 MCG tablet Take 1 tablet (88 mcg total) by mouth daily before breakfast.   No facility-administered encounter medications on file as of 12/23/2023.    ALLERGIES: Allergies  Allergen Reactions   Elemental Sulfur    Lipitor [Atorvastatin] Other (See Comments)   Penicillins     VACCINATION STATUS: Immunization History  Administered Date(s) Administered   Influenza-Unspecified 01/07/2020   Moderna Sars-Covid-2 Vaccination 05/21/2019, 06/18/2019, 02/10/2020     HPI  Douglas Lee is 78 y.o. male who returns for follow-up in the management of hypothyroidism with repeat thyroid  function tests.  PMD: Rosamond Leta NOVAK, MD.   During his prior visits he had subacute thyroiditis which resolved into partial hypothyroidism.  He continues to respond to  levothyroxine  replacement, currently on 88 mcg p.o. daily before breakfast.   He reports consistency and compliance and returns with labs consistent with appropriate replacement.  He has no new complaints today.    he denies dysphagia, choking, shortness of breath, no recent voice change.    he denies family history of thyroid  dysfunction nor any family history of thyroid  cancer.  he denies personal history of goiter. he is not on any anti-thyroid  medications nor on any thyroid  hormone supplements.  Review of systems  Constitutional: + Minimally fluctuating body weight,  current Body mass index is 26.69 kg/m. , no fatigue, no subjective hyperthermia, no subjective hypothermia Eyes: no blurry vision, no xerophthalmia ENT: no sore throat, no nodules palpated in throat, no dysphagia/odynophagia, no hoarseness Cardiovascular: no chest pain, no shortness of breath, + intermittent palpitations (hx afib-sees cardiology), no leg swelling Respiratory: no cough, no shortness of breath Gastrointestinal: no nausea/vomiting/diarrhea Musculoskeletal: no muscle/joint aches Skin: no rashes, no hyperemia Neurological: no tremors, no numbness, no tingling, no dizziness Psychiatric: no depression, no anxiety   Objective:    BP 110/80 (BP Location: Left Arm, Patient Position: Sitting, Cuff Size: Large)   Pulse 65   Ht 5' 10 (1.778 m)   Wt 186 lb (84.4 kg)   BMI 26.69 kg/m   Wt Readings from Last 3 Encounters:  12/23/23 186 lb (84.4 kg)  06/19/23 185 lb (83.9 kg)  03/02/23 187 lb 9.6 oz (85.1 kg)                          Physical Exam- Limited  Constitutional:  Body mass index is 26.69 kg/m. , not in acute distress, normal state of mind Eyes:  EOMI, no exophthalmos Musculoskeletal: no gross deformities, strength intact in all four extremities, no gross restriction of joint movements Skin:  no rashes, no hyperemia Neurological: no tremor with outstretched hands    CMP     Component Value  Date/Time   NA 140 11/30/2018 1441   K 4.5 11/30/2018 1441   CL 105 11/30/2018 1441   CO2 25 11/30/2018 1441   GLUCOSE 95 11/30/2018 1441   BUN 12 02/10/2020 0000   CREATININE 1.0 02/10/2020 0000   CREATININE 1.23 (H) 11/30/2018 1441   CALCIUM  9.9 11/30/2018 1441   GFRNONAA 70 02/10/2020 0000   GFRAA 81 02/10/2020 0000     CBC    Component Value Date/Time   WBC 11.2 (H) 11/30/2018 1441   RBC 4.28 11/30/2018 1441   HGB 13.8 11/30/2018 1441   HCT 41.5 11/30/2018 1441   PLT 305 11/30/2018  1441   MCV 97.0 11/30/2018 1441   MCH 32.2 11/30/2018 1441   MCHC 33.3 11/30/2018 1441   RDW 12.2 11/30/2018 1441    Lab Results  Component Value Date   TSH 1.17 12/04/2023   TSH 1.14 06/08/2023   TSH 3.22 06/19/2021   TSH 1.18 12/17/2020   TSH 2.62 06/18/2020   TSH 7.29 (A) 05/02/2020   TSH 0.02 (A) 02/02/2020   FREET4 1.10 12/04/2023   FREET4 1.21 06/08/2023     Latest Reference Range & Units 12/17/20 00:00 06/19/21 00:00 06/08/23 13:03 12/04/23 00:00  TSH 0.41 - 5.90  1.18 (E) 3.22 (E) 1.14 (E) 1.17  T4,Free(Direct) ng/dL   8.78 (E) 8.89  (E): External lab result  Assessment & Plan:   1.  Hypothyroidism due to Hashimoto's thyroiditis  His previsit TFTs are consistent with appropriate hormone replacement.  He is advised to continue Levothyroxine  88 mcg po daily before breakfast.  Will recheck labs prior to next visit and adjust dose accordingly.   - We discussed about the correct intake of his thyroid  hormone, on empty stomach at fasting, with water, separated by at least 30 minutes from breakfast and other medications,  and separated by more than 4 hours from calcium , iron, multivitamins, acid reflux medications (PPIs). -Patient is made aware of the fact that thyroid  hormone replacement is needed for life, dose to be adjusted by periodic monitoring of thyroid  function tests.     -Patient is advised to maintain close follow up with Rosamond Leta NOVAK, MD for primary care  needs.    I spent  32  minutes in the care of the patient today including review of labs from Thyroid  Function, CMP, and other relevant labs ; imaging/biopsy records (current and previous including abstractions from other facilities); face-to-face time discussing  his lab results and symptoms, medications doses, his options of short and long term treatment based on the latest standards of care / guidelines;   and documenting the encounter.  Douglas Lee  participated in the discussions, expressed understanding, and voiced agreement with the above plans.  All questions were answered to his satisfaction. he is encouraged to contact clinic should he have any questions or concerns prior to his return visit.     Follow up plan: Return in about 6 months (around 06/21/2024) for Thyroid  follow up, Previsit labs.   Thank you for involving me in the care of this pleasant patient, and I will continue to update you with his progress.  Douglas Lee, [facility] [address] [place], [place] [address] Phone: [phone] Fax: [phone]

## 2024-01-01 DIAGNOSIS — I10 Essential (primary) hypertension: Secondary | ICD-10-CM | POA: Diagnosis not present

## 2024-01-01 DIAGNOSIS — K1379 Other lesions of oral mucosa: Secondary | ICD-10-CM | POA: Diagnosis not present

## 2024-01-01 DIAGNOSIS — Z299 Encounter for prophylactic measures, unspecified: Secondary | ICD-10-CM | POA: Diagnosis not present

## 2024-01-12 DIAGNOSIS — N1831 Chronic kidney disease, stage 3a: Secondary | ICD-10-CM | POA: Diagnosis not present

## 2024-01-12 DIAGNOSIS — I4891 Unspecified atrial fibrillation: Secondary | ICD-10-CM | POA: Diagnosis not present

## 2024-01-12 DIAGNOSIS — Z299 Encounter for prophylactic measures, unspecified: Secondary | ICD-10-CM | POA: Diagnosis not present

## 2024-01-12 DIAGNOSIS — I10 Essential (primary) hypertension: Secondary | ICD-10-CM | POA: Diagnosis not present

## 2024-01-12 DIAGNOSIS — T162XXA Foreign body in left ear, initial encounter: Secondary | ICD-10-CM | POA: Diagnosis not present

## 2024-01-19 ENCOUNTER — Other Ambulatory Visit: Payer: Self-pay | Admitting: Cardiology

## 2024-01-27 DIAGNOSIS — I482 Chronic atrial fibrillation, unspecified: Secondary | ICD-10-CM | POA: Diagnosis not present

## 2024-01-27 DIAGNOSIS — N1831 Chronic kidney disease, stage 3a: Secondary | ICD-10-CM | POA: Diagnosis not present

## 2024-01-27 DIAGNOSIS — I10 Essential (primary) hypertension: Secondary | ICD-10-CM | POA: Diagnosis not present

## 2024-01-27 DIAGNOSIS — Z299 Encounter for prophylactic measures, unspecified: Secondary | ICD-10-CM | POA: Diagnosis not present

## 2024-03-07 ENCOUNTER — Other Ambulatory Visit: Payer: Self-pay | Admitting: Cardiology

## 2024-03-07 DIAGNOSIS — I480 Paroxysmal atrial fibrillation: Secondary | ICD-10-CM

## 2024-03-08 MED ORDER — APIXABAN 5 MG PO TABS: 5.0000 mg | ORAL_TABLET | Freq: Two times a day (BID) | ORAL | 0 refills | Status: AC

## 2024-03-14 ENCOUNTER — Ambulatory Visit: Attending: Cardiology | Admitting: Cardiology

## 2024-03-14 ENCOUNTER — Encounter: Payer: Self-pay | Admitting: Cardiology

## 2024-03-14 VITALS — BP 128/72 | HR 63 | Ht 70.0 in | Wt 193.8 lb

## 2024-03-14 DIAGNOSIS — I480 Paroxysmal atrial fibrillation: Secondary | ICD-10-CM | POA: Diagnosis not present

## 2024-03-14 DIAGNOSIS — E782 Mixed hyperlipidemia: Secondary | ICD-10-CM | POA: Diagnosis not present

## 2024-03-14 NOTE — Progress Notes (Signed)
    Cardiology Office Note  Date: 03/14/2024   ID: Quandarius, Nill July 26, 1945, MRN [id number]  History of Present Illness: Douglas Lee is a 78 y.o. male last seen in November 2024.  He is here for a routine visit.  Reports only a rare sense of palpitations, no recurrent exertional symptoms, no dizziness or syncope.  He continues to follow at [facility].  I reviewed his medications which are stable.  He does not report any spontaneous bleeding problems on Eliquis .  Lipid panel from earlier in the year looked good on Crestor  10 mg daily.  Blood pressure today is adequately controlled.  I reviewed his ECG today which shows atrial fibrillation at 68 bpm.  Physical Exam: VS:  BP 128/72   Pulse 63   Ht 5' 10 (1.778 m)   Wt 193 lb 12.8 oz (87.9 kg)   SpO2 100%   BMI 27.81 kg/m , BMI Body mass index is 27.81 kg/m.  Wt Readings from Last 3 Encounters:  03/14/24 193 lb 12.8 oz (87.9 kg)  12/23/23 186 lb (84.4 kg)  06/19/23 185 lb (83.9 kg)    General: Patient appears comfortable at rest. HEENT: Conjunctiva and lids normal. Neck: Supple, no elevated JVP or carotid bruits. Lungs: Clear to auscultation, nonlabored breathing at rest. Cardiac: Irregularly irregular, no significant murmur or gallop. Extremities: No pitting edema.  ECG:  An ECG dated 03/02/2023 was personally reviewed today and demonstrated:  Rate controlled atrial fibrillation.  Labwork: December 2024: Hemoglobin 12.3, platelets 283, potassium 3.8, BUN 16, creatinine 1.27 June 2023: Cholesterol 136, triglycerides 70, HDL 70, LDL 52 12/04/2023: TSH 1.17   Other Studies Reviewed Today:  No interval cardiac testing for review today.  Assessment and Plan:  1.  Permanent atrial fibrillation with CHA2DS2-VASc score of 5.  Heart rate well-controlled on Lopressor  100 mg twice daily.  He continues on Eliquis  5 mg twice daily for stroke prophylaxis.  Keep follow-up with [facility] for lab work.   He does not report any spontaneous bleeding problems.  ECG reviewed and stable.   2.  Mixed hyperlipidemia.  LDL 52 and HDL 70 in March.  Continue Crestor  10 mg daily.   3.  Primary hypertension.  Blood pressure adequately controlled today.  He is also on lisinopril  40 mg daily and HCTZ 12.5 mg daily.  Disposition:  Follow up 1 year.  Signed, Jayson JUDITHANN Sierras, M.D., F.A.C.C. [facility]

## 2024-03-14 NOTE — Patient Instructions (Addendum)

## 2024-05-07 ENCOUNTER — Other Ambulatory Visit: Payer: Self-pay | Admitting: Cardiology

## 2024-05-10 ENCOUNTER — Telehealth: Payer: Self-pay | Admitting: Cardiology

## 2024-05-10 NOTE — Telephone Encounter (Signed)
" °*  STAT* If patient is at the pharmacy, call can be transferred to refill team.   1. Which medications need to be refilled? (please list name of each medication and dose if known)  metoprolol  tartrate (LOPRESSOR ) 100 MG tablet     2. Would you like to learn more about the convenience, safety, & potential cost savings by using the [facility]? No    3. Are you open to using the [facility] (Type [facility]. No    4. Which pharmacy/location (including street and city if local pharmacy) is medication to be sent to?   [id number] - [place], [place] - [address]   5. Do they need a 30 day or 90 day supply? 90 day     "

## 2024-05-11 MED ORDER — METOPROLOL TARTRATE 100 MG PO TABS: 100.0000 mg | ORAL_TABLET | Freq: Two times a day (BID) | ORAL | 3 refills | Status: AC

## 2024-05-11 NOTE — Telephone Encounter (Signed)
 REFILL SENT

## 2024-06-21 ENCOUNTER — Ambulatory Visit: Admitting: Nurse Practitioner

## 2024-09-01 ENCOUNTER — Ambulatory Visit: Admitting: Family Medicine
